# Patient Record
Sex: Male | Born: 1985 | Race: Black or African American | Hispanic: No | Marital: Single | Smoking: Current every day smoker
Health system: Southern US, Community
[De-identification: ages and names within clinical notes are randomized; demographics above are authoritative.]

## PROBLEM LIST (undated history)

## (undated) DIAGNOSIS — F419 Anxiety disorder, unspecified: Secondary | ICD-10-CM

## (undated) DIAGNOSIS — F32A Depression, unspecified: Secondary | ICD-10-CM

## (undated) DIAGNOSIS — R569 Unspecified convulsions: Secondary | ICD-10-CM

## (undated) DIAGNOSIS — G939 Disorder of brain, unspecified: Secondary | ICD-10-CM

## (undated) DIAGNOSIS — F909 Attention-deficit hyperactivity disorder, unspecified type: Secondary | ICD-10-CM

## (undated) DIAGNOSIS — F329 Major depressive disorder, single episode, unspecified: Secondary | ICD-10-CM

## (undated) DIAGNOSIS — T7840XA Allergy, unspecified, initial encounter: Secondary | ICD-10-CM

## (undated) HISTORY — DX: Major depressive disorder, single episode, unspecified: F32.9

## (undated) HISTORY — DX: Disorder of brain, unspecified: G93.9

## (undated) HISTORY — DX: Attention-deficit hyperactivity disorder, unspecified type: F90.9

## (undated) HISTORY — DX: Depression, unspecified: F32.A

## (undated) HISTORY — DX: Unspecified convulsions: R56.9

## (undated) HISTORY — DX: Allergy, unspecified, initial encounter: T78.40XA

## (undated) HISTORY — DX: Anxiety disorder, unspecified: F41.9

---
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
## 2004-07-28 ENCOUNTER — Emergency Department: Payer: Self-pay | Admitting: Emergency Medicine

## 2007-06-04 IMAGING — CR RIGHT ANKLE - COMPLETE 3+ VIEW
1 series · 5 of 5 positions shown · non-contrast
Comparison: none

REASON FOR EXAM: INJURY
COMMENTS:

[Series 563: antero_posterior · 0.11mm/px · 5 of 5 slices shown]
[im 1/5]
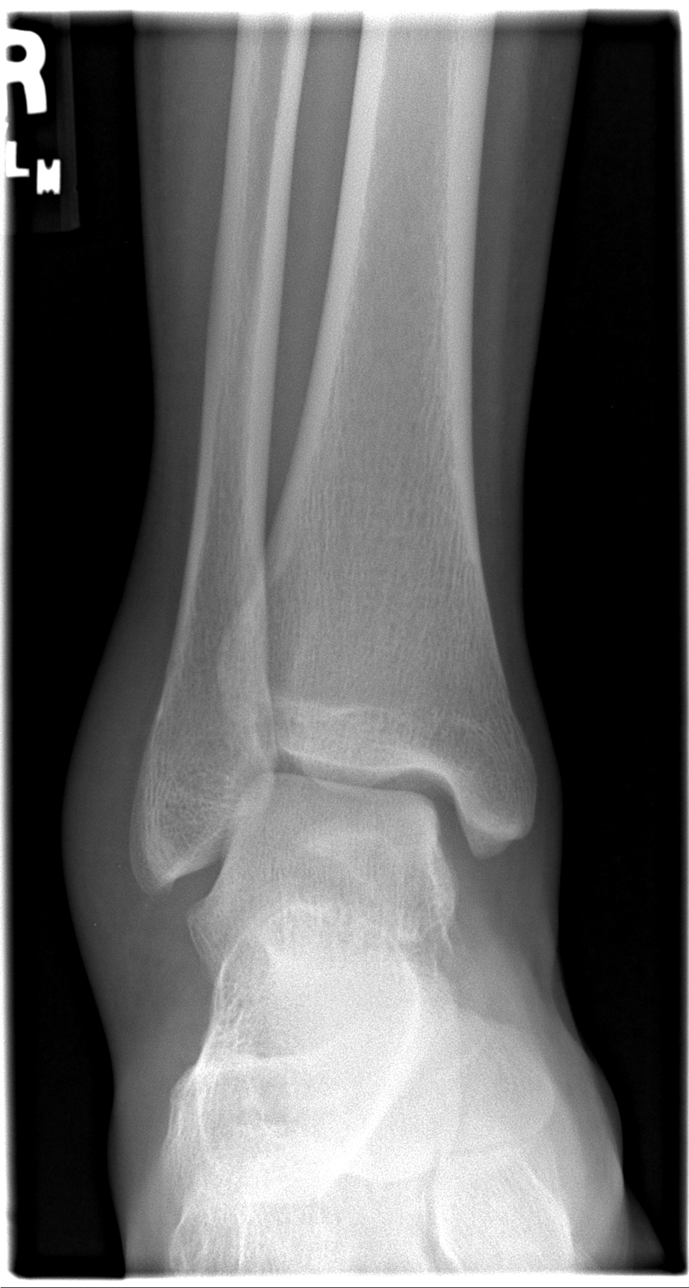
[im 2/5]
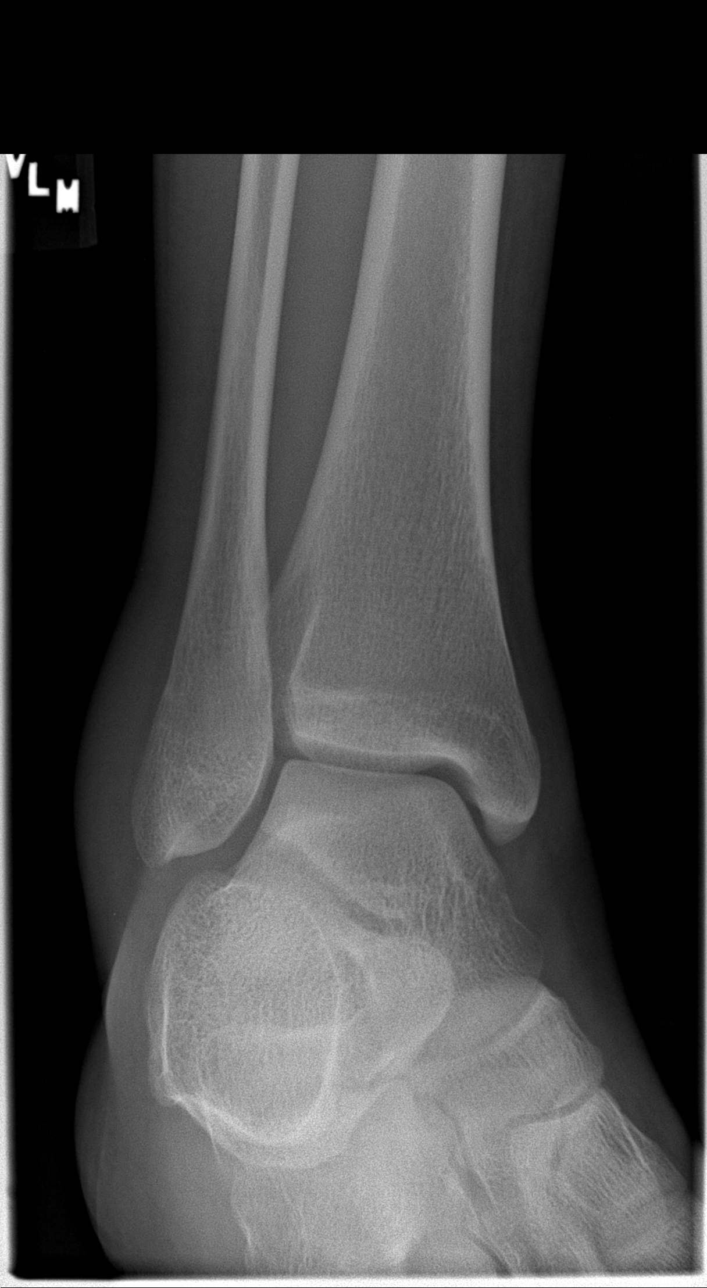
[im 3/5]
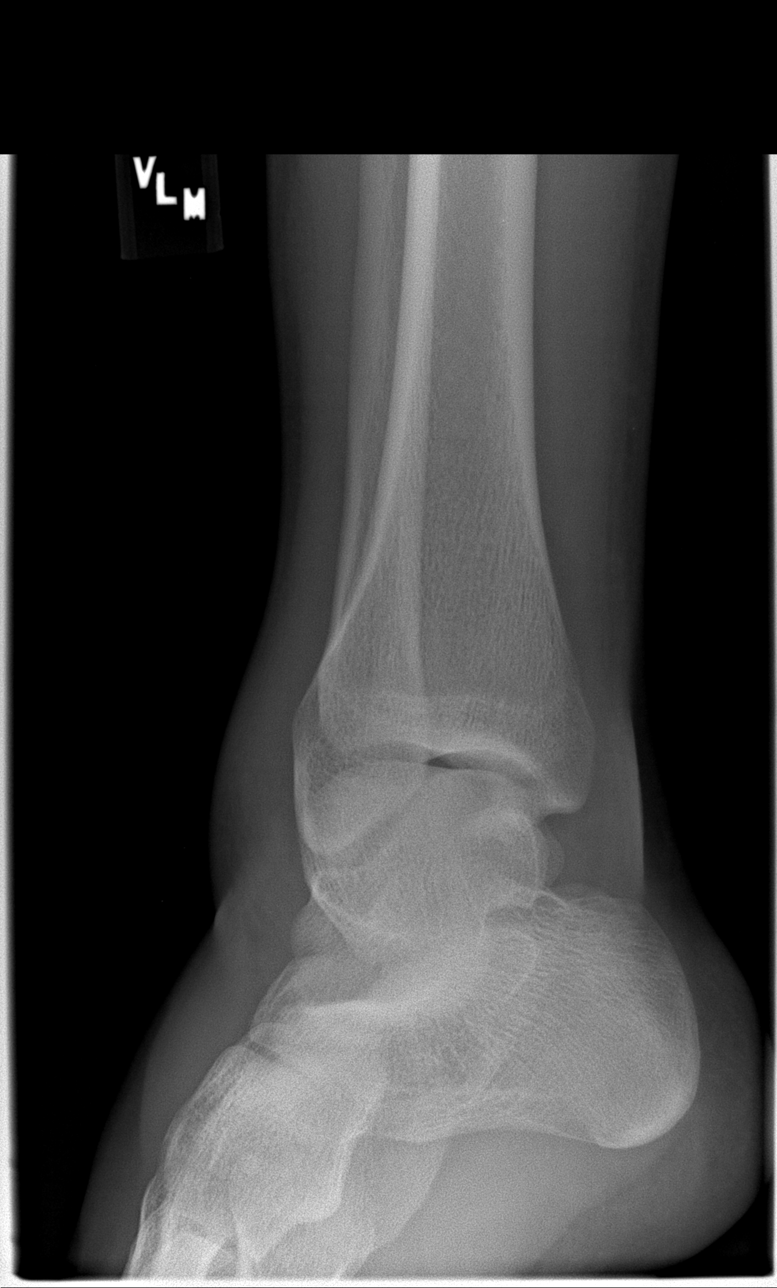
[im 4/5]
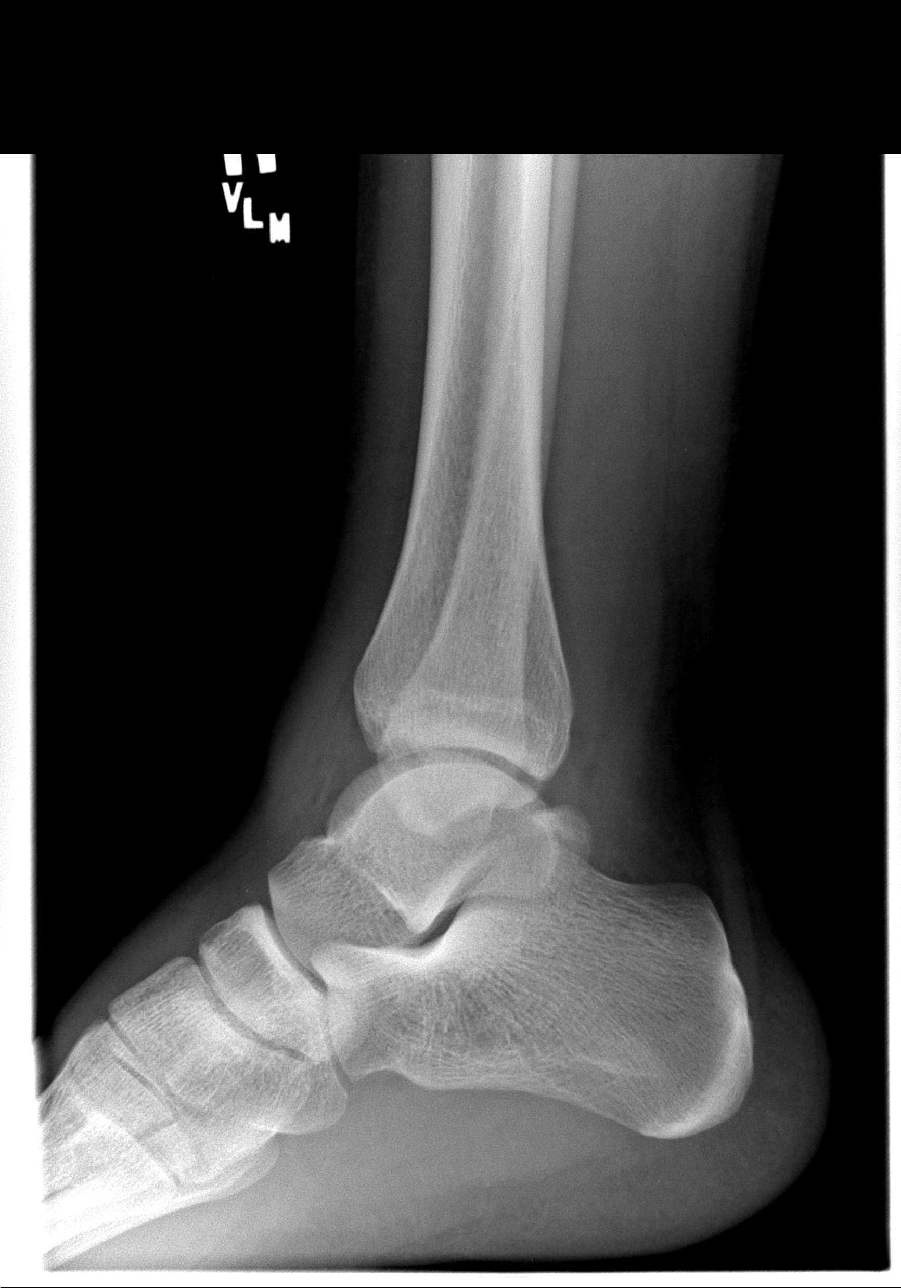
[im 5/5]
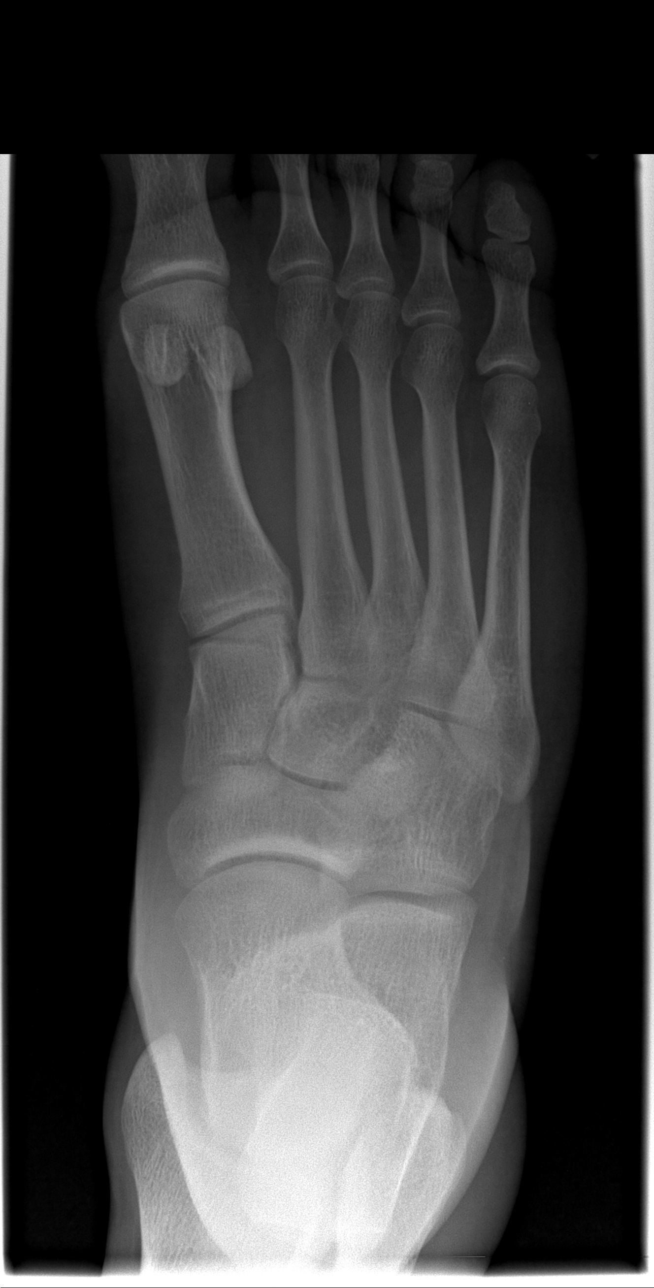

[5 of 5 positions shown; findings below may reference images not displayed]

PROCEDURE:     DXR - DXR ANKLE RIGHT COMPLETE  - July 28, 2004  [DATE]

RESULT:     Five views of the ankle reveal the joint mortise to be
preserved.  The talar dome is intact.  There is a large amount of soft
tissue swelling over the lateral malleolus.  The medial and posterior
malleolar regions are normal in appearance.  I see no fracture.
IMPRESSION: There are findings consistent with soft tissue injury over
the lateral malleolus.  I do not see evidence of an acute fracture.

## 2013-05-28 ENCOUNTER — Encounter: Payer: Self-pay | Admitting: Podiatry

## 2013-05-28 ENCOUNTER — Ambulatory Visit (INDEPENDENT_AMBULATORY_CARE_PROVIDER_SITE_OTHER): Payer: Medicaid Other | Admitting: Podiatry

## 2013-05-28 ENCOUNTER — Ambulatory Visit (INDEPENDENT_AMBULATORY_CARE_PROVIDER_SITE_OTHER): Payer: Medicaid Other

## 2013-05-28 VITALS — BP 107/70 | HR 69 | Resp 16 | Ht 70.0 in | Wt 155.0 lb

## 2013-05-28 DIAGNOSIS — M722 Plantar fascial fibromatosis: Secondary | ICD-10-CM

## 2013-05-28 DIAGNOSIS — M79673 Pain in unspecified foot: Secondary | ICD-10-CM

## 2013-05-28 DIAGNOSIS — D492 Neoplasm of unspecified behavior of bone, soft tissue, and skin: Secondary | ICD-10-CM

## 2013-05-28 MED ORDER — TRIAMCINOLONE ACETONIDE 10 MG/ML IJ SUSP
10.0000 mg | Freq: Once | INTRAMUSCULAR | Status: AC
  Administered 2013-05-28: 10 mg

## 2013-05-28 NOTE — Progress Notes (Signed)
Subjective:     Patient ID: Ryan Cohen, male   DOB: 1985/10/04, 28 y.o.   MRN: [id number]  Foot Pain   patient presents with painful nodule underneath the and his father and his uncle have the same thing and it becomes tender if he does a lot of walkingright foot stating it's been there for at least a year and a half   Review of Systems  All other systems reviewed and are negative.      Objective:   Physical Exam  Nursing note and vitals reviewed. Constitutional: He is oriented to person, place, and time.  Cardiovascular: Intact distal pulses.   Musculoskeletal: Normal range of motion.  Neurological: He is oriented to person, place, and time.  Skin: Skin is warm.   neurovascular status intact with muscle strength adequate and range of motion of the subtalar midtarsal joint within normal limits. Patient is found to have a large nodule plantar aspect right arch within the plantar fascia measuring about 2.5 cm in length by 1.5 cm in width and it's painful when pressed. 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 I discussed condition with him and caregiver and have recommended injection treatment to see if we can shrink it were reduce inflammation with possibility for excision in future even though it is a difficult procedure with difficult recovery. Patient wants this approach and had it injected 3 mg Kenalog 5 mg Xylocaine Marcaine mixture

## 2013-05-28 NOTE — Progress Notes (Signed)
   Subjective:    Patient ID: Ryan Cohen, male    DOB: 1985/04/10, 28 y.o.   MRN: [id number]  HPI Comments: i have a large knot on the bottom of my right foot. i have had it for 1.63yrs. When i stand for a long time it will hurt. Its remained the same and has not gotten bigger. i dont do anything for my foot.  Foot Pain      Review of Systems  All other systems reviewed and are negative.      Objective:   Physical Exam        Assessment & Plan:

## 2014-09-14 ENCOUNTER — Ambulatory Visit (INDEPENDENT_AMBULATORY_CARE_PROVIDER_SITE_OTHER): Payer: Medicaid Other | Admitting: Family Medicine

## 2014-09-14 ENCOUNTER — Encounter: Payer: Self-pay | Admitting: Family Medicine

## 2014-09-14 VITALS — BP 110/74 | HR 46 | Temp 98.0°F | Ht 69.4 in | Wt 141.8 lb

## 2014-09-14 DIAGNOSIS — S069XAA Unspecified intracranial injury with loss of consciousness status unknown, initial encounter: Secondary | ICD-10-CM | POA: Insufficient documentation

## 2014-09-14 DIAGNOSIS — M79671 Pain in right foot: Secondary | ICD-10-CM

## 2014-09-14 DIAGNOSIS — R2241 Localized swelling, mass and lump, right lower limb: Secondary | ICD-10-CM

## 2014-09-14 DIAGNOSIS — S069X9A Unspecified intracranial injury with loss of consciousness of unspecified duration, initial encounter: Secondary | ICD-10-CM | POA: Insufficient documentation

## 2014-09-14 DIAGNOSIS — S069X0S Unspecified intracranial injury without loss of consciousness, sequela: Secondary | ICD-10-CM

## 2014-09-14 NOTE — Patient Instructions (Signed)

## 2014-09-14 NOTE — Assessment & Plan Note (Signed)
Has guardians. Will obtain records from previous doctor.
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
## 2014-09-14 NOTE — Assessment & Plan Note (Signed)
Of unclear etiology. Will refer back to podiatry. Referral generated today.

## 2014-09-14 NOTE — Assessment & Plan Note (Signed)
Likely due to standing on cystic lesions. Will refer to podiatry for evaluation and possible removal of lesions.

## 2014-10-04 ENCOUNTER — Ambulatory Visit: Payer: Self-pay | Admitting: Podiatry

## 2014-10-04 ENCOUNTER — Ambulatory Visit: Payer: Medicaid Other | Admitting: Podiatry

## 2014-10-06 ENCOUNTER — Ambulatory Visit: Payer: Medicaid Other | Admitting: Family Medicine

## 2014-10-13 ENCOUNTER — Ambulatory Visit (INDEPENDENT_AMBULATORY_CARE_PROVIDER_SITE_OTHER): Payer: Medicaid Other

## 2014-10-13 ENCOUNTER — Encounter: Payer: Self-pay | Admitting: Podiatry

## 2014-10-13 ENCOUNTER — Ambulatory Visit (INDEPENDENT_AMBULATORY_CARE_PROVIDER_SITE_OTHER): Payer: Medicaid Other | Admitting: Podiatry

## 2014-10-13 DIAGNOSIS — M722 Plantar fascial fibromatosis: Secondary | ICD-10-CM

## 2014-10-13 DIAGNOSIS — R52 Pain, unspecified: Secondary | ICD-10-CM

## 2014-10-13 NOTE — Progress Notes (Signed)
   Subjective:    Patient ID: Danny Valdez, male    DOB: 1985/12/13, 29 y.o.   MRN: [id number]  HPI  29 year old male presents the office today for concerns about possible cyst the bottom of his right foot which didn't present for the last several years. He states the every male in his family as these lesions of the bottom of the feet. He's very have grown of the last couple years and he has some pain with pressure. He gets an occasional sharp pain to his toes on the bottom of his foot. He states that he previously had area drained however there were unable to get any fluid out of the. He is also pre-existing treated for wart. He denies any redness to the area or any skin changes. No other complaints at this time.   Review of Systems  All other systems reviewed and are negative.      Objective:   Physical Exam AAO x3, NAD DP/PT pulses palpable bilaterally, CRT less than 3 seconds Protective sensation intact with Simms Weinstein monofilament, vibratory sensation intact, Achilles tendon reflex intact On the plantar aspect of the right foot on the medial arch of the foot there are 2 soft tissue masses. There appeared to be somewhat firm, nonmobile and within the medial band of the plantar fascia. The distal lesion measures 1.5 x 2 cm in the more proximal lesion is 1 x 1 cm. There is mild tenderness to palpation overlying the area. There is no skin change or open lesions. No other lesions identified bilaterally. No other areas of tenderness to bilateral lower extremities. MMT 5/5, ROM WNL.  No open lesions or pre-ulcerative lesions.  No overlying edema, erythema, increase in warmth to bilateral lower extremities.  No pain with calf compression, swelling, warmth, erythema bilaterally.     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  -Etiology of symptoms were discussed -At this time we'll proceed with conservative treatment. I discussed steroid injection however he wishes to hold off. Prescribed compound cream to include verapamil. -Prescription for inserts were given to the patient for [facility]. -Follow-up after inserts or sooner if any problems arise. In the meantime, encouraged to call the office with any questions, concerns, change in symptoms.   Ovid Curd, DPM

## 2014-10-14 ENCOUNTER — Encounter: Payer: Self-pay | Admitting: Family Medicine

## 2014-10-14 ENCOUNTER — Ambulatory Visit (INDEPENDENT_AMBULATORY_CARE_PROVIDER_SITE_OTHER): Payer: Medicaid Other | Admitting: Family Medicine

## 2014-10-14 VITALS — BP 129/74 | HR 73 | Temp 97.4°F | Ht 68.7 in | Wt 140.0 lb

## 2014-10-14 DIAGNOSIS — Z23 Encounter for immunization: Secondary | ICD-10-CM | POA: Diagnosis not present

## 2014-10-14 DIAGNOSIS — Z0000 Encounter for general adult medical examination without abnormal findings: Secondary | ICD-10-CM

## 2014-10-14 NOTE — Progress Notes (Signed)
BP 129/74 mmHg  Pulse 73  Temp(Src) 97.4 F (36.3 C)  Ht 5' 8.7" (1.745 m)  Wt 140 lb (63.504 kg)  BMI 20.86 kg/m2  SpO2 96%   Subjective:    Patient ID: Danny Valdez, male    DOB: 02/15/1985, 29 y.o.   MRN: [id number]  HPI: Danny Valdez is a 28 y.o. male presenting on 10/14/2014 for comprehensive medical examination. Current medical complaints include: None.   Has been feeling well. Here today for annual physical. No concerns. Doing well.   He currently lives with: stepmother and father Interim Problems from his last visit: no  Depression Screen done today and results listed below:  Depression screen [facility] 2/9 10/14/2014  Decreased Interest 0  Down, Depressed, Hopeless 0  PHQ - 2 Score 0     Past Medical History:  Past Medical History  Diagnosis Date  . ADHD (attention deficit hyperactivity disorder)   . Seizures   . Allergy   . Depression   . Anxiety   . Brain damage     Surgical History:  History reviewed. No pertinent past surgical history.  Medications:  No current outpatient prescriptions on file prior to visit.   No current facility-administered medications on file prior to visit.    Allergies:  No Known Allergies  Social History:  Social History   Social History  . Marital Status: Single    Spouse Name: N/A  . Number of Children: N/A  . Years of Education: N/A   Occupational History  . Not on file.   Social History Main Topics  . Smoking status: Former Smoker    Quit date: 04/05/2014  . Smokeless tobacco: Never Used  . Alcohol Use: No  . Drug Use: No  . Sexual Activity: No   Other Topics Concern  . Not on file   Social History Narrative   History  Smoking status  . Former Smoker  . Quit date: 04/05/2014  Smokeless tobacco  . Never Used   History  Alcohol Use No    Family History:  Family History  Problem Relation Age of Onset  . Hypertension Father   . Diabetes Maternal Grandmother   . Hypertension Maternal  Grandmother   . Diabetes Paternal Grandmother   . Gout Paternal Grandfather   . Diabetes Paternal Grandfather   . Hypertension Paternal Grandfather   . Aneurysm Paternal Grandfather     Past medical history, surgical history, medications, allergies, family history and social history reviewed with patient today and changes made to appropriate areas of the chart.   Review of Systems  Constitutional: Negative.   HENT: Negative.   Eyes: Negative.   Respiratory: Negative.   Cardiovascular: Negative.   Gastrointestinal: Negative.   Genitourinary: Negative.   Musculoskeletal: Positive for back pain. Negative for myalgias, joint pain, falls and neck pain.  Skin: Negative.   Neurological: Negative.   Endo/Heme/Allergies: Negative.   Psychiatric/Behavioral: Negative.     All other ROS negative except what is listed above and in the HPI.      Objective:    BP 129/74 mmHg  Pulse 73  Temp(Src) 97.4 F (36.3 C)  Ht 5' 8.7" (1.745 m)  Wt 140 lb (63.504 kg)  BMI 20.86 kg/m2  SpO2 96%  Wt Readings from Last 3 Encounters:  10/14/14 140 lb (63.504 kg)  09/14/14 141 lb 12.8 oz (64.32 kg)    Physical Exam  Constitutional: He is oriented to person, place, and time. He appears well-developed and well-nourished.  No distress.  HENT:  Head: Normocephalic and atraumatic.  Right Ear: Hearing and external ear normal.  Left Ear: Hearing and external ear normal.  Nose: Nose normal.  Mouth/Throat: Oropharynx is clear and moist. No oropharyngeal exudate.  Eyes: Conjunctivae, EOM and lids are normal. Pupils are equal, round, and reactive to light. Right eye exhibits no discharge. Left eye exhibits no discharge. No scleral icterus.  Neck: Normal range of motion. Neck supple. No JVD present. No tracheal deviation present. No thyromegaly present.  Cardiovascular: Normal rate, regular rhythm, normal heart sounds and intact distal pulses.  Exam reveals no gallop and no friction rub.   No murmur  heard. Pulmonary/Chest: Effort normal and breath sounds normal. No stridor. No respiratory distress. He has no wheezes. He has no rales. He exhibits no tenderness.  Abdominal: Soft. Bowel sounds are normal. He exhibits no distension and no mass. There is no tenderness. There is no rebound and no guarding. Hernia confirmed negative in the right inguinal area and confirmed negative in the left inguinal area.  Genitourinary: Testes normal and penis normal. Right testis shows no mass, no swelling and no tenderness. Right testis is descended. Cremasteric reflex is not absent on the right side. Left testis shows no mass, no swelling and no tenderness. Left testis is descended. Cremasteric reflex is not absent on the left side. Circumcised. No penile tenderness. No discharge found.  Done with Tiffany Reel, CMA as chaperone  Musculoskeletal: Normal range of motion. He exhibits no edema or tenderness.  Lymphadenopathy:    He has no cervical adenopathy.       Right: No inguinal adenopathy present.       Left: No inguinal adenopathy present.  Neurological: He is alert and oriented to person, place, and time. He has normal reflexes. He displays normal reflexes. No cranial nerve deficit. He exhibits normal muscle tone. Coordination normal.  Skin: Skin is warm, dry and intact. No rash noted. He is not diaphoretic. No erythema. No pallor.  Psychiatric: He has a normal mood and affect. His speech is normal and behavior is normal. Judgment and thought content normal. Cognition and memory are normal.  Nursing note and vitals reviewed.   No results found for this or any previous visit.    Assessment & Plan:   Problem List Items Addressed This Visit    None    Visit Diagnoses    Routine general medical examination at a health care facility    -  Primary    Up to date on vaccines. Checking screening labs next week when he is fasting. No need for any other screenings at this time. Work on diet and exercise.  Monitor.    Relevant Orders    CBC With Differential/Platelet    Comprehensive metabolic panel    Lipid Panel w/o Chol/HDL Ratio    TSH    UA/M w/rflx Culture, Routine    Immunization due        Flu shot given today.     Relevant Orders    Flu Vaccine QUAD 36+ mos PF IM (Fluarix & Fluzone Quad PF) (Completed)       LABORATORY TESTING:  Health maintenance labs ordered today as discussed above.   IMMUNIZATIONS:   - Tdap: Tetanus vaccination status reviewed: last tetanus booster within 10 years. - Influenza: Administered today  PATIENT COUNSELING:    Sexuality: Discussed sexually transmitted diseases, partner selection, use of condoms, avoidance of unintended pregnancy  and contraceptive alternatives.   Advised to avoid  cigarette smoking.  I discussed with the patient that most people either abstain from alcohol or drink within safe limits (<=14/week and <=4 drinks/occasion for males, <=7/weeks and <= 3 drinks/occasion for females) and that the risk for alcohol disorders and other health effects rises proportionally with the number of drinks per week and how often a drinker exceeds daily limits.  Discussed cessation/primary prevention of drug use and availability of treatment for abuse.   Diet: Encouraged to adjust caloric intake to maintain  or achieve ideal body weight, to reduce intake of dietary saturated fat and total fat, to limit sodium intake by avoiding high sodium foods and not adding table salt, and to maintain adequate dietary potassium and calcium preferably from fresh fruits, vegetables, and low-fat dairy products.    stressed the importance of regular exercise  Injury prevention: Discussed safety belts, safety helmets, smoke detector, smoking near bedding or upholstery.   Dental health: Discussed importance of regular tooth brushing, flossing, and dental visits.   Follow up plan: NEXT PREVENTATIVE PHYSICAL DUE IN 1 YEAR. Return Next week for blood work, 1 year for  PE.

## 2014-10-14 NOTE — Patient Instructions (Signed)

## 2014-10-17 ENCOUNTER — Other Ambulatory Visit: Payer: Medicaid Other

## 2014-10-17 DIAGNOSIS — Z0000 Encounter for general adult medical examination without abnormal findings: Secondary | ICD-10-CM

## 2014-10-17 LAB — UA/M W/RFLX CULTURE, ROUTINE
Bilirubin, UA: NEGATIVE
Glucose, UA: NEGATIVE
Ketones, UA: NEGATIVE
LEUKOCYTES UA: NEGATIVE
NITRITE UA: NEGATIVE
PH UA: 6.5 (ref 5.0–7.5)
Protein, UA: NEGATIVE
RBC UA: NEGATIVE
SPEC GRAV UA: 1.015 (ref 1.005–1.030)
Urobilinogen, Ur: 1 mg/dL (ref 0.2–1.0)

## 2014-10-17 NOTE — Addendum Note (Signed)
Addended by: Dorcas Carrow on: 10/17/2014 08:36 AM   Modules accepted: Orders, SmartSet

## 2014-10-18 ENCOUNTER — Telehealth: Payer: Self-pay | Admitting: Family Medicine

## 2014-10-18 DIAGNOSIS — D72819 Decreased white blood cell count, unspecified: Secondary | ICD-10-CM

## 2014-10-18 LAB — COMPREHENSIVE METABOLIC PANEL
A/G RATIO: 1.7 (ref 1.1–2.5)
ALK PHOS: 44 IU/L (ref 39–117)
ALT: 13 IU/L (ref 0–44)
AST: 17 IU/L (ref 0–40)
Albumin: 4.5 g/dL (ref 3.5–5.5)
BILIRUBIN TOTAL: 1.6 mg/dL — AB (ref 0.0–1.2)
BUN/Creatinine Ratio: 11 (ref 8–19)
BUN: 11 mg/dL (ref 6–20)
CO2: 25 mmol/L (ref 18–29)
Calcium: 9.4 mg/dL (ref 8.7–10.2)
Chloride: 99 mmol/L (ref 97–108)
Creatinine, Ser: 0.98 mg/dL (ref 0.76–1.27)
GFR calc Af Amer: 119 mL/min/{1.73_m2} (ref >59)
GFR, EST NON AFRICAN AMERICAN: 103 mL/min/{1.73_m2} (ref >59)
GLOBULIN, TOTAL: 2.6 g/dL (ref 1.5–4.5)
Glucose: 100 mg/dL — ABNORMAL HIGH (ref 65–99)
POTASSIUM: 4.1 mmol/L (ref 3.5–5.2)
SODIUM: 140 mmol/L (ref 134–144)
Total Protein: 7.1 g/dL (ref 6.0–8.5)

## 2014-10-18 LAB — LIPID PANEL W/O CHOL/HDL RATIO
Cholesterol, Total: 173 mg/dL (ref 100–199)
HDL: 60 mg/dL (ref >39)
LDL Calculated: 99 mg/dL (ref 0–99)
TRIGLYCERIDES: 69 mg/dL (ref 0–149)
VLDL Cholesterol Cal: 14 mg/dL (ref 5–40)

## 2014-10-18 LAB — TSH: TSH: 2.39 u[IU]/mL (ref 0.450–4.500)

## 2014-10-18 LAB — CBC WITH DIFFERENTIAL/PLATELET
BASOS ABS: 0 10*3/uL (ref 0.0–0.2)
Basos: 1 %
EOS (ABSOLUTE): 0.2 10*3/uL (ref 0.0–0.4)
Eos: 6 %
Hematocrit: 41.1 % (ref 37.5–51.0)
Hemoglobin: 13.4 g/dL (ref 12.6–17.7)
Immature Grans (Abs): 0 10*3/uL (ref 0.0–0.1)
Immature Granulocytes: 0 %
LYMPHS ABS: 1.5 10*3/uL (ref 0.7–3.1)
Lymphs: 51 %
MCH: 30.5 pg (ref 26.6–33.0)
MCHC: 32.6 g/dL (ref 31.5–35.7)
MCV: 94 fL (ref 79–97)
MONOCYTES: 11 %
Monocytes Absolute: 0.3 10*3/uL (ref 0.1–0.9)
NEUTROS ABS: 0.9 10*3/uL — AB (ref 1.4–7.0)
Neutrophils: 31 %
Platelets: 247 10*3/uL (ref 150–379)
RBC: 4.39 x10E6/uL (ref 4.14–5.80)
RDW: 12.5 % (ref 12.3–15.4)
WBC: 2.9 10*3/uL — ABNORMAL LOW (ref 3.4–10.8)

## 2014-10-18 NOTE — Telephone Encounter (Signed)
Spoke with Dad. Will come in in 2 weeks for recheck on his CBC.

## 2014-10-18 NOTE — Telephone Encounter (Signed)
LMOM for him or father to call back. Labs normal except WBC which is quite low. Will recheck in 2 weeks in case this is due to post-infectious cause. If not better, will refer to hematology.

## 2014-11-01 ENCOUNTER — Other Ambulatory Visit: Payer: Self-pay

## 2014-11-08 ENCOUNTER — Other Ambulatory Visit: Payer: Medicaid Other

## 2014-11-08 DIAGNOSIS — D72819 Decreased white blood cell count, unspecified: Secondary | ICD-10-CM

## 2014-11-09 ENCOUNTER — Encounter: Payer: Self-pay | Admitting: Family Medicine

## 2014-11-09 LAB — CBC WITH DIFFERENTIAL/PLATELET
BASOS ABS: 0 10*3/uL (ref 0.0–0.2)
Basos: 1 %
EOS (ABSOLUTE): 0.1 10*3/uL (ref 0.0–0.4)
Eos: 4 %
Hematocrit: 40 % (ref 37.5–51.0)
Hemoglobin: 13.2 g/dL (ref 12.6–17.7)
IMMATURE GRANS (ABS): 0 10*3/uL (ref 0.0–0.1)
IMMATURE GRANULOCYTES: 0 %
Lymphocytes Absolute: 1.8 10*3/uL (ref 0.7–3.1)
Lymphs: 47 %
MCH: 30.8 pg (ref 26.6–33.0)
MCHC: 33 g/dL (ref 31.5–35.7)
MCV: 93 fL (ref 79–97)
Monocytes Absolute: 0.4 10*3/uL (ref 0.1–0.9)
Monocytes: 11 %
NEUTROS PCT: 37 %
Neutrophils Absolute: 1.4 10*3/uL (ref 1.4–7.0)
PLATELETS: 253 10*3/uL (ref 150–379)
RBC: 4.29 x10E6/uL (ref 4.14–5.80)
RDW: 11.9 % — AB (ref 12.3–15.4)
WBC: 3.9 10*3/uL (ref 3.4–10.8)

## 2015-09-13 ENCOUNTER — Encounter (INDEPENDENT_AMBULATORY_CARE_PROVIDER_SITE_OTHER): Payer: Self-pay

## 2015-10-17 ENCOUNTER — Encounter: Payer: Medicaid Other | Admitting: Family Medicine

## 2015-11-06 ENCOUNTER — Encounter: Payer: Medicaid Other | Admitting: Family Medicine

## 2015-12-05 ENCOUNTER — Ambulatory Visit: Payer: Self-pay | Admitting: Family Medicine

## 2015-12-07 ENCOUNTER — Ambulatory Visit (INDEPENDENT_AMBULATORY_CARE_PROVIDER_SITE_OTHER): Payer: Medicaid Other | Admitting: Family Medicine

## 2015-12-07 ENCOUNTER — Encounter: Payer: Self-pay | Admitting: Family Medicine

## 2015-12-07 VITALS — BP 114/70 | HR 61 | Temp 97.6°F | Wt 139.0 lb

## 2015-12-07 DIAGNOSIS — Z113 Encounter for screening for infections with a predominantly sexual mode of transmission: Secondary | ICD-10-CM

## 2015-12-07 DIAGNOSIS — J069 Acute upper respiratory infection, unspecified: Secondary | ICD-10-CM

## 2015-12-07 MED ORDER — AMOXICILLIN-POT CLAVULANATE 875-125 MG PO TABS
1.0000 | ORAL_TABLET | Freq: Two times a day (BID) | ORAL | 0 refills | Status: DC
Start: 2015-12-07 — End: 2015-12-25
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
## 2015-12-07 NOTE — Patient Instructions (Signed)
Follow up as needed

## 2015-12-08 LAB — RPR: RPR: NONREACTIVE

## 2015-12-08 LAB — HSV(HERPES SIMPLEX VRS) I + II AB-IGG
HSV 1 Glycoprotein G Ab, IgG: 52.4 index — ABNORMAL HIGH (ref 0.00–0.90)
HSV 2 Glycoprotein G Ab, IgG: <0.91 index (ref 0.00–0.90)

## 2015-12-08 LAB — HIV ANTIBODY (ROUTINE TESTING W REFLEX): HIV Screen 4th Generation wRfx: NONREACTIVE

## 2015-12-09 LAB — GC/CHLAMYDIA PROBE AMP
Chlamydia trachomatis, NAA: NEGATIVE
Neisseria gonorrhoeae by PCR: NEGATIVE

## 2015-12-11 ENCOUNTER — Telehealth: Payer: Self-pay | Admitting: Family Medicine

## 2015-12-11 NOTE — Telephone Encounter (Signed)
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 If he ever notices a cold sore, he can let me know and I can give him medicine for it.

## 2015-12-11 NOTE — Telephone Encounter (Signed)
Called and spoke to patient's father, DPR signed under media in chart. I let patient's father know what Fleet ContrasRachel said. Father stated that the patient gets cold sores often and that they would call the next time the patient got one.

## 2015-12-25 ENCOUNTER — Encounter: Payer: Self-pay | Admitting: Family Medicine

## 2015-12-25 ENCOUNTER — Ambulatory Visit (INDEPENDENT_AMBULATORY_CARE_PROVIDER_SITE_OTHER): Payer: Medicaid Other | Admitting: Family Medicine

## 2015-12-25 ENCOUNTER — Other Ambulatory Visit: Payer: Self-pay | Admitting: Family Medicine

## 2015-12-25 VITALS — BP 95/77 | HR 65 | Temp 97.8°F | Ht 69.25 in | Wt 140.6 lb

## 2015-12-25 DIAGNOSIS — Z0000 Encounter for general adult medical examination without abnormal findings: Secondary | ICD-10-CM | POA: Diagnosis not present

## 2015-12-25 DIAGNOSIS — Z23 Encounter for immunization: Secondary | ICD-10-CM | POA: Diagnosis not present

## 2015-12-25 LAB — UA/M W/RFLX CULTURE, ROUTINE
Bilirubin, UA: NEGATIVE
Glucose, UA: NEGATIVE
KETONES UA: NEGATIVE
Leukocytes, UA: NEGATIVE
NITRITE UA: NEGATIVE
Protein, UA: NEGATIVE
SPEC GRAV UA: 1.02 (ref 1.005–1.030)
UUROB: 0.2 mg/dL (ref 0.2–1.0)
pH, UA: 6 (ref 5.0–7.5)

## 2015-12-25 LAB — MICROSCOPIC EXAMINATION: WBC UA: NONE SEEN /HPF (ref 0–?)

## 2015-12-25 NOTE — Patient Instructions (Addendum)

## 2015-12-25 NOTE — Progress Notes (Signed)
BP 95/77 (BP Location: Left Arm, Patient Position: Sitting, Cuff Size: Normal)   Pulse 65   Temp 97.8 F (36.6 C)   Ht 5' 9.25" (1.759 m)   Wt 140 lb 9.6 oz (63.8 kg)   SpO2 97%   BMI 20.61 kg/m    Subjective:    Patient ID: Danny Valdez, male    DOB: 09/11/1985, 30 y.o.   MRN: [id number]  HPI: Danny Valdez is a 30 y.o. male presenting on 12/25/2015 for comprehensive medical examination. Current medical complaints include:none  He currently lives with: Dad and step-mom Interim Problems from his last visit: no  Depression Screen done today and results listed below:  [facility] 12/25/2015 10/14/2014  Decreased Interest 0 0  Down, Depressed, Hopeless 0 0  PHQ - 2 Score 0 0  Altered sleeping 1 -  Tired, decreased energy 0 -  Change in appetite 0 -  Feeling bad or failure about yourself  0 -  Trouble concentrating 0 -  Moving slowly or fidgety/restless 0 -  Suicidal thoughts 0 -  PHQ-9 Score 1 -    Past Medical History:  Past Medical History:  Diagnosis Date  . ADHD (attention deficit hyperactivity disorder)   . Allergy   . Anxiety   . Brain damage   . Depression   . Seizures (HCC)     Surgical History:  No past surgical history on file.  Medications:  No current outpatient prescriptions on file prior to visit.   No current facility-administered medications on file prior to visit.     Allergies:  No Known Allergies  Social History:  Social History   Social History  . Marital status: Single    Spouse name: N/A  . Number of children: N/A  . Years of education: N/A   Occupational History  . Not on file.   Social History Main Topics  . Smoking status: Former Smoker    Quit date: 04/05/2014  . Smokeless tobacco: Never Used  . Alcohol use No     Comment: Occasionally/Rarely  . Drug use: No  . Sexual activity: No   Other Topics Concern  . Not on file   Social History Narrative  . No narrative on file   History  Smoking  Status  . Former Smoker  . Quit date: 04/05/2014  Smokeless Tobacco  . Never Used   History  Alcohol Use No    Comment: Occasionally/Rarely    Family History:  Family History  Problem Relation Age of Onset  . Hypertension Father   . Diabetes Maternal Grandmother   . Hypertension Maternal Grandmother   . Diabetes Paternal Grandmother   . Gout Paternal Grandfather   . Diabetes Paternal Grandfather   . Hypertension Paternal Grandfather   . Aneurysm Paternal Grandfather     Past medical history, surgical history, medications, allergies, family history and social history reviewed with patient today and changes made to appropriate areas of the chart.   Review of Systems  Constitutional: Negative.   HENT: Negative.   Eyes: Negative.   Respiratory: Negative.   Cardiovascular: Negative.   Gastrointestinal: Negative.   Genitourinary: Negative.   Musculoskeletal: Negative.   Skin: Negative.   Neurological: Negative.   Endo/Heme/Allergies: Negative.   Psychiatric/Behavioral: Negative.     All other ROS negative except what is listed above and in the HPI.      Objective:    BP 95/77 (BP Location: Left Arm, Patient Position: Sitting, Cuff Size:  Normal)   Pulse 65   Temp 97.8 F (36.6 C)   Ht 5' 9.25" (1.759 m)   Wt 140 lb 9.6 oz (63.8 kg)   SpO2 97%   BMI 20.61 kg/m   Wt Readings from Last 3 Encounters:  12/25/15 140 lb 9.6 oz (63.8 kg)  12/07/15 139 lb (63 kg)  10/14/14 140 lb (63.5 kg)    Physical Exam  Constitutional: He is oriented to person, place, and time. He appears well-developed and well-nourished. No distress.  HENT:  Head: Normocephalic and atraumatic.  Right Ear: Hearing and external ear normal.  Left Ear: Hearing and external ear normal.  Nose: Nose normal.  Mouth/Throat: Oropharynx is clear and moist. No oropharyngeal exudate.  Eyes: Conjunctivae, EOM and lids are normal. Pupils are equal, round, and reactive to light. Right eye exhibits no  discharge. Left eye exhibits no discharge. No scleral icterus.  Neck: Normal range of motion. Neck supple. No JVD present. No tracheal deviation present. No thyromegaly present.  Cardiovascular: Normal rate, regular rhythm, normal heart sounds and intact distal pulses.  Exam reveals no gallop and no friction rub.   No murmur heard. Pulmonary/Chest: Effort normal and breath sounds normal. No stridor. No respiratory distress. He has no wheezes. He has no rales. He exhibits no tenderness.  Abdominal: Soft. Bowel sounds are normal. He exhibits no distension and no mass. There is no tenderness. There is no rebound and no guarding. Hernia confirmed negative in the right inguinal area and confirmed negative in the left inguinal area.  Genitourinary: Testes normal and penis normal. Cremasteric reflex is present. Circumcised. No penile tenderness.  Genitourinary Comments: Genital exam done with Myrtha Mantis, CMA present  Musculoskeletal: Normal range of motion. He exhibits no edema, tenderness or deformity.  Lymphadenopathy:    He has no cervical adenopathy.  Neurological: He is alert and oriented to person, place, and time. He has normal reflexes. He displays normal reflexes. No cranial nerve deficit. He exhibits normal muscle tone. Coordination normal.  Skin: Skin is warm and intact. No rash noted. He is not diaphoretic. No erythema. No pallor.  Psychiatric: He has a normal mood and affect. His speech is normal and behavior is normal. Judgment and thought content normal. Cognition and memory are normal.  Vitals reviewed.   Results for orders placed or performed in visit on 12/07/15  GC/Chlamydia Probe Amp  Result Value Ref Range   Chlamydia trachomatis, NAA Negative Negative   Neisseria gonorrhoeae by PCR Negative Negative  HIV antibody  Result Value Ref Range   HIV Screen 4th Generation wRfx Non Reactive Non Reactive  RPR  Result Value Ref Range   RPR Ser Ql Non Reactive Non Reactive    HSV(herpes simplex vrs) 1+2 ab-IgG  Result Value Ref Range   HSV 1 Glycoprotein G Ab, IgG 52.40 (H) 0.00 - 0.90 index   HSV 2 Glycoprotein G Ab, IgG <0.91 0.00 - 0.90 index      Assessment & Plan:   Problem List Items Addressed This Visit    None    Visit Diagnoses    Routine general medical examination at a health care facility    -  Primary   Doing well. Up to date on vaccines. Screening labs checked today. Continue diet and exercise. Call with any concerns.    Need for influenza vaccination       Flu shot given today.   Relevant Orders   Flu Vaccine QUAD 36+ mos PF IM (Fluarix & Fluzone  Quad PF) (Completed)       Discussed aspirin prophylaxis for myocardial infarction prevention and decision was it was not indicated  LABORATORY TESTING:  Health maintenance labs ordered today as discussed above.   IMMUNIZATIONS:   - Tdap: Tetanus vaccination status reviewed: last tetanus booster within 10 years. - Influenza: Administered today - Pneumovax: Not applicable  PATIENT COUNSELING:    Sexuality: Discussed sexually transmitted diseases, partner selection, use of condoms, avoidance of unintended pregnancy  and contraceptive alternatives.   Advised to avoid cigarette smoking.  I discussed with the patient that most people either abstain from alcohol or drink within safe limits (<=14/week and <=4 drinks/occasion for males, <=7/weeks and <= 3 drinks/occasion for females) and that the risk for alcohol disorders and other health effects rises proportionally with the number of drinks per week and how often a drinker exceeds daily limits.  Discussed cessation/primary prevention of drug use and availability of treatment for abuse.   Diet: Encouraged to adjust caloric intake to maintain  or achieve ideal body weight, to reduce intake of dietary saturated fat and total fat, to limit sodium intake by avoiding high sodium foods and not adding table salt, and to maintain adequate dietary  potassium and calcium preferably from fresh fruits, vegetables, and low-fat dairy products.    stressed the importance of regular exercise  Injury prevention: Discussed safety belts, safety helmets, smoke detector, smoking near bedding or upholstery.   Dental health: Discussed importance of regular tooth brushing, flossing, and dental visits.   Follow up plan: NEXT PREVENTATIVE PHYSICAL DUE IN 1 YEAR. Return in about 1 year (around 12/24/2016) for Physical.

## 2015-12-26 ENCOUNTER — Encounter: Payer: Self-pay | Admitting: Family Medicine

## 2015-12-26 LAB — COMPREHENSIVE METABOLIC PANEL
ALBUMIN: 4.6 g/dL (ref 3.5–5.5)
ALK PHOS: 48 IU/L (ref 39–117)
ALT: 8 IU/L (ref 0–44)
AST: 17 IU/L (ref 0–40)
Albumin/Globulin Ratio: 1.6 (ref 1.2–2.2)
BILIRUBIN TOTAL: 1.5 mg/dL — AB (ref 0.0–1.2)
BUN / CREAT RATIO: 12 (ref 9–20)
BUN: 11 mg/dL (ref 6–20)
CHLORIDE: 100 mmol/L (ref 96–106)
CO2: 25 mmol/L (ref 18–29)
Calcium: 9.6 mg/dL (ref 8.7–10.2)
Creatinine, Ser: 0.91 mg/dL (ref 0.76–1.27)
GFR calc non Af Amer: 112 mL/min/{1.73_m2} (ref >59)
GFR, EST AFRICAN AMERICAN: 129 mL/min/{1.73_m2} (ref >59)
GLOBULIN, TOTAL: 2.9 g/dL (ref 1.5–4.5)
Glucose: 72 mg/dL (ref 65–99)
Potassium: 4.2 mmol/L (ref 3.5–5.2)
SODIUM: 140 mmol/L (ref 134–144)
TOTAL PROTEIN: 7.5 g/dL (ref 6.0–8.5)

## 2015-12-26 LAB — LIPID PANEL W/O CHOL/HDL RATIO
CHOLESTEROL TOTAL: 165 mg/dL (ref 100–199)
HDL: 58 mg/dL (ref >39)
LDL CALC: 81 mg/dL (ref 0–99)
Triglycerides: 130 mg/dL (ref 0–149)
VLDL Cholesterol Cal: 26 mg/dL (ref 5–40)

## 2015-12-26 LAB — CBC WITH DIFFERENTIAL/PLATELET
BASOS ABS: 0 10*3/uL (ref 0.0–0.2)
BASOS: 1 %
EOS (ABSOLUTE): 0.3 10*3/uL (ref 0.0–0.4)
EOS: 8 %
HEMATOCRIT: 40.7 % (ref 37.5–51.0)
HEMOGLOBIN: 13.5 g/dL (ref 13.0–17.7)
Immature Grans (Abs): 0 10*3/uL (ref 0.0–0.1)
Immature Granulocytes: 1 %
LYMPHS ABS: 1.9 10*3/uL (ref 0.7–3.1)
Lymphs: 46 %
MCH: 30.7 pg (ref 26.6–33.0)
MCHC: 33.2 g/dL (ref 31.5–35.7)
MCV: 93 fL (ref 79–97)
MONOCYTES: 10 %
Monocytes Absolute: 0.4 10*3/uL (ref 0.1–0.9)
NEUTROS ABS: 1.4 10*3/uL (ref 1.4–7.0)
Neutrophils: 34 %
Platelets: 290 10*3/uL (ref 150–379)
RBC: 4.4 x10E6/uL (ref 4.14–5.80)
RDW: 12.2 % — ABNORMAL LOW (ref 12.3–15.4)
WBC: 4 10*3/uL (ref 3.4–10.8)

## 2015-12-26 LAB — TSH: TSH: 0.798 u[IU]/mL (ref 0.450–4.500)

## 2016-12-05 ENCOUNTER — Encounter: Payer: Medicaid Other | Admitting: Family Medicine

## 2016-12-23 ENCOUNTER — Ambulatory Visit: Payer: Self-pay
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
## 2016-12-24 ENCOUNTER — Ambulatory Visit: Payer: Medicaid Other | Admitting: Family Medicine

## 2016-12-26 ENCOUNTER — Encounter: Payer: Medicaid Other | Admitting: Family Medicine

## 2017-05-30 ENCOUNTER — Other Ambulatory Visit: Payer: Self-pay

## 2017-05-30 ENCOUNTER — Emergency Department
Admission: EM | Admit: 2017-05-30 | Discharge: 2017-05-30 | Disposition: A | Discharge status: Home / Self Care | Payer: Medicaid Other | Attending: Emergency Medicine | Admitting: Emergency Medicine

## 2017-05-30 ENCOUNTER — Encounter: Payer: Self-pay | Admitting: Emergency Medicine

## 2017-05-30 DIAGNOSIS — S61215A Laceration without foreign body of left ring finger without damage to nail, initial encounter: Secondary | ICD-10-CM | POA: Diagnosis not present

## 2017-05-30 DIAGNOSIS — Y93E6 Activity, residential relocation: Secondary | ICD-10-CM | POA: Insufficient documentation

## 2017-05-30 DIAGNOSIS — Z23 Encounter for immunization: Secondary | ICD-10-CM | POA: Insufficient documentation

## 2017-05-30 DIAGNOSIS — F17200 Nicotine dependence, unspecified, uncomplicated: Secondary | ICD-10-CM | POA: Diagnosis not present

## 2017-05-30 DIAGNOSIS — W268XXA Contact with other sharp object(s), not elsewhere classified, initial encounter: Secondary | ICD-10-CM | POA: Diagnosis not present

## 2017-05-30 DIAGNOSIS — Y929 Unspecified place or not applicable: Secondary | ICD-10-CM | POA: Diagnosis not present

## 2017-05-30 DIAGNOSIS — Y998 Other external cause status: Secondary | ICD-10-CM | POA: Insufficient documentation

## 2017-05-30 DIAGNOSIS — S61211A Laceration without foreign body of left index finger without damage to nail, initial encounter: Secondary | ICD-10-CM | POA: Diagnosis not present

## 2017-05-30 MED ORDER — LIDOCAINE HCL (PF) 1 % IJ SOLN
INTRAMUSCULAR | Status: AC
  Filled 2017-05-30: qty 5

## 2017-05-30 MED ORDER — TETANUS-DIPHTH-ACELL PERTUSSIS 5-2.5-18.5 LF-MCG/0.5 IM SUSP: 0.5000 mL | Freq: Once | INTRAMUSCULAR | Status: DC

## 2017-05-30 MED ORDER — LIDOCAINE HCL (PF) 1 % IJ SOLN: 5.0000 mL | Freq: Once | INTRAMUSCULAR | Status: DC

## 2017-05-30 NOTE — ED Triage Notes (Signed)
Pt presents to ED via POV c/o approx. 1 cm laceration to L 4th finger. Small amount of bleeding present. Pt states he was helping family to move a freezer and cut hand on exposed metal in back. Pt does not think he has had a tetanus shot within 5 years.

## 2017-05-30 NOTE — ED Provider Notes (Signed)
[facility] EMERGENCY DEPARTMENT Provider Note   CSN: [id number] Arrival date & time: 05/30/17  1557     History   Chief Complaint Chief Complaint  Patient presents with  . Laceration    HPI Ryan Cohen is a 32 y.o. male presents with family for evaluation of left index and ring finger laceration.  Patient was helping to lift a freezer when patient was lifting he cut his left index and ring finger on a piece of metal.  Patient denies any limited range of motion of the left index finger or ring finger.  He has lacerations to the volar aspect of the DIP joints of both digits.  His tetanus is not up-to-date.  His pain is mild.  Sensation is intact distally.  He has not had any medications for pain.  HPI  History reviewed. No pertinent past medical history.  There are no active problems to display for this patient.   History reviewed. No pertinent surgical history.      Home Medications    Prior to Admission medications   Not on File    Family History History reviewed. No pertinent family history.  Social History Social History   Tobacco Use  . Smoking status: Current Every Day Smoker  Substance Use Topics  . Alcohol use: Yes  . Drug use: Not on file     Allergies   Patient has no known allergies.   Review of Systems Review of Systems  Constitutional: Negative for fever.  Respiratory: Negative for shortness of breath.   Cardiovascular: Negative for chest pain.  Musculoskeletal: Negative for joint swelling.  Skin: Positive for wound. Negative for rash.  Neurological: Negative for numbness.     Physical Exam Updated Vital Signs BP (!) 156/99 (BP Location: Right Arm)   Pulse 91   Temp 98.7 F (37.1 C) (Oral)   Resp 18   Ht 5\' 8"  (1.727 m)   Wt 73.9 kg (163 lb)   SpO2 99%   BMI 24.78 kg/m   Physical Exam  Constitutional: He is oriented to person, place, and time. He appears well-developed and well-nourished.  HENT:    Head: Normocephalic and atraumatic.  Eyes: Conjunctivae are normal.  Neck: Normal range of motion.  Cardiovascular: Normal rate.  Pulmonary/Chest: Effort normal. No respiratory distress.  Musculoskeletal: Normal range of motion.  Examination of the left hand shows 2 volar lacerations one on the volar aspect of the DIP joint of the index finger and the second on the volar aspect of the DIP joint of the ring finger.  Patient has full flexion of the index and ring fingers.  No deficits are noted.  Sensation is intact distally.  There is no visible or palpable foreign body.  Neurological: He is alert and oriented to person, place, and time.  Skin: Skin is warm. No rash noted.  Psychiatric: He has a normal mood and affect. His behavior is normal. Thought content normal.     ED Treatments / Results  Labs (all labs ordered are listed, but only abnormal results are displayed) Labs Reviewed - No data to display  EKG None  Radiology No results found.  Procedures .Marland KitchenLaceration Repair Date/Time: 05/30/2017 5:23 PM Performed by: Duanne Guess, PA-C Authorized by: Duanne Guess, PA-C   Consent:    Consent obtained:  Verbal   Consent given by:  Patient   Risks discussed:  Infection and pain   Alternatives discussed:  No treatment Anesthesia (see MAR for exact  dosages):    Anesthesia method:  None Laceration details:    Location:  Finger   Finger location:  L index finger (And left ring finger)   Length (cm):  4   Depth (mm):  2 Repair type:    Repair type:  Simple Pre-procedure details:    Preparation:  Patient was prepped and draped in usual sterile fashion Exploration:    Contaminated: no   Treatment:    Area cleansed with:  Betadine   Amount of cleaning:  Standard   Irrigation solution:  Sterile saline   Visualized foreign bodies/material removed: no   Skin repair:    Repair method:  Sutures   Suture size:  5-0   Suture material:  Nylon   Number of sutures:   8 Approximation:    Approximation:  Close Post-procedure details:    Dressing:  Non-adherent dressing   Patient tolerance of procedure:  Tolerated well, no immediate complications   (including critical care time)  Medications Ordered in ED Medications  lidocaine (PF) (XYLOCAINE) 1 % injection 5 mL (has no administration in time range)  Tdap (BOOSTRIX) injection 0.5 mL (has no administration in time range)  lidocaine (PF) (XYLOCAINE) 1 % injection (has no administration in time range)     Initial Impression / Assessment and Plan / ED Course  I have reviewed the triage vital signs and the nursing notes.  Pertinent labs & imaging results that were available during my care of the patient were reviewed by me and considered in my medical decision making (see chart for details).     32 year old male with lacerations to the left index and ring fingers.  Lacerations were thoroughly irrigated and repaired with sutures.  No neurological or tendon deficits noted.  Patient is educated on signs symptoms return to the ED for such as any redness, drainage, warmth, fevers.  He will follow-up in 8 to 10 days for suture removal.  Tetanus was updated today in the ED.  Final Clinical Impressions(s) / ED Diagnoses   Final diagnoses:  Laceration of left index finger without foreign body without damage to nail, initial encounter  Laceration of left ring finger without foreign body without damage to nail, initial encounter    ED Discharge Orders    None       Renata Caprice 05/30/17 1727    Eula Listen, MD 05/31/17 0006

## 2017-05-30 NOTE — ED Notes (Signed)
The patients hand was soaked in a normal saline solution for a few minutes.

## 2017-05-30 NOTE — Discharge Instructions (Addendum)
Please keep laceration sites clean.  Keep laceration sites covered.  He may shower but do not submerge underwater.  Avoid direct pressure and heavy lifting over the laceration site.  Return to the ER for any redness, warmth, swelling.  Follow-up with primary care provider or walk-in clinic in 8 to 10 days for suture removal

## 2017-06-06 ENCOUNTER — Ambulatory Visit (INDEPENDENT_AMBULATORY_CARE_PROVIDER_SITE_OTHER): Payer: Medicaid Other | Admitting: Unknown Physician Specialty

## 2017-06-06 ENCOUNTER — Encounter: Payer: Self-pay | Admitting: Unknown Physician Specialty

## 2017-06-06 VITALS — BP 114/71 | HR 58 | Temp 97.7°F | Ht 68.3 in | Wt 162.8 lb

## 2017-06-06 DIAGNOSIS — Z4802 Encounter for removal of sutures: Secondary | ICD-10-CM | POA: Diagnosis not present

## 2017-06-06 NOTE — Progress Notes (Signed)
BP 114/71   Pulse (!) 58   Temp 97.7 F (36.5 C) (Oral)   Ht 5' 8.3" (1.735 m)   Wt 162 lb 12.8 oz (73.8 kg)   SpO2 97%   BMI 24.54 kg/m    Subjective:    Patient ID: Danny Valdez, male    DOB: 05-Aug-1985, 32 y.o.   MRN: [id number]  HPI: Danny Valdez is a 32 y.o. male  Chief Complaint  Patient presents with  . Suture / Staple Removal    pt states he went to [facility] last week and had 8 stitches put in his fingers, states 4 have already come out and need 4 taken out   Pt states he was moving a freezer and cut his hand.  Bleeding profusely and 8 stitches already came out after washing dishes.  Laceration healing well  Relevant past medical, surgical, family and social history reviewed and updated as indicated. Interim medical history since our last visit reviewed. Allergies and medications reviewed and updated.  Review of Systems  Per HPI unless specifically indicated above     Objective:    BP 114/71   Pulse (!) 58   Temp 97.7 F (36.5 C) (Oral)   Ht 5' 8.3" (1.735 m)   Wt 162 lb 12.8 oz (73.8 kg)   SpO2 97%   BMI 24.54 kg/m   Wt Readings from Last 3 Encounters:  06/06/17 162 lb 12.8 oz (73.8 kg)  12/25/15 140 lb 9.6 oz (63.8 kg)  12/07/15 139 lb (63 kg)    Physical Exam  Constitutional: He is oriented to person, place, and time. He appears well-developed and well-nourished. No distress.  HENT:  Head: Normocephalic and atraumatic.  Eyes: Conjunctivae and lids are normal. Right eye exhibits no discharge. Left eye exhibits no discharge. No scleral icterus.  Pulmonary/Chest: Effort normal.  Abdominal: Normal appearance. There is no splenomegaly or hepatomegaly.  Musculoskeletal: Normal range of motion.  Neurological: He is alert and oriented to person, place, and time.  Skin: Skin is intact. No rash noted. No pallor.  3 sutures removed from left  index and 1 from left ring finger.  Wound well approximated.  Covered with polysporin ointment and bandaid    Psychiatric: He has a normal mood and affect. His behavior is normal. Judgment and thought content normal.    Results for orders placed or performed in visit on 12/25/15  Microscopic Examination  Result Value Ref Range   WBC, UA None seen 0 - 5 /hpf   RBC, UA 0-2 0 - 2 /hpf   Epithelial Cells (non renal) 0-10 0 - 10 /hpf  CBC with Differential/Platelet  Result Value Ref Range   WBC 4.0 3.4 - 10.8 x10E3/uL   RBC 4.40 4.14 - 5.80 x10E6/uL   Hemoglobin 13.5 13.0 - 17.7 g/dL   Hematocrit 40.9 81.1 - 51.0 %   MCV 93 79 - 97 fL   MCH 30.7 26.6 - 33.0 pg   MCHC 33.2 31.5 - 35.7 g/dL   RDW 91.4 (L) 78.2 - 95.6 %   Platelets 290 150 - 379 x10E3/uL   Neutrophils 34 Not Estab. %   Lymphs 46 Not Estab. %   Monocytes 10 Not Estab. %   Eos 8 Not Estab. %   Basos 1 Not Estab. %   Neutrophils Absolute 1.4 1.4 - 7.0 x10E3/uL   Lymphocytes Absolute 1.9 0.7 - 3.1 x10E3/uL   Monocytes Absolute 0.4 0.1 - 0.9 x10E3/uL   EOS (ABSOLUTE) 0.3  0.0 - 0.4 x10E3/uL   Basophils Absolute 0.0 0.0 - 0.2 x10E3/uL   Immature Granulocytes 1 Not Estab. %   Immature Grans (Abs) 0.0 0.0 - 0.1 x10E3/uL  Comprehensive metabolic panel  Result Value Ref Range   Glucose 72 65 - 99 mg/dL   BUN 11 6 - 20 mg/dL   Creatinine, Ser 5.78 0.76 - 1.27 mg/dL   GFR calc non Af Amer 112 >59 mL/min/1.73   GFR calc Af Amer 129 >59 mL/min/1.73   BUN/Creatinine Ratio 12 9 - 20   Sodium 140 134 - 144 mmol/L   Potassium 4.2 3.5 - 5.2 mmol/L   Chloride 100 96 - 106 mmol/L   CO2 25 18 - 29 mmol/L   Calcium 9.6 8.7 - 10.2 mg/dL   Total Protein 7.5 6.0 - 8.5 g/dL   Albumin 4.6 3.5 - 5.5 g/dL   Globulin, Total 2.9 1.5 - 4.5 g/dL   Albumin/Globulin Ratio 1.6 1.2 - 2.2   Bilirubin Total 1.5 (H) 0.0 - 1.2 mg/dL   Alkaline Phosphatase 48 39 - 117 IU/L   AST 17 0 - 40 IU/L   ALT 8 0 - 44 IU/L  Lipid Panel w/o Chol/HDL Ratio  Result Value Ref Range   Cholesterol, Total 165 100 - 199 mg/dL   Triglycerides 469 0 - 149 mg/dL   HDL 58  >62 mg/dL   VLDL Cholesterol Cal 26 5 - 40 mg/dL   LDL Calculated 81 0 - 99 mg/dL  TSH  Result Value Ref Range   TSH 0.798 0.450 - 4.500 uIU/mL  UA/M w/rflx Culture, Routine  Result Value Ref Range   Specific Gravity, UA 1.020 1.005 - 1.030   pH, UA 6.0 5.0 - 7.5   Color, UA Yellow Yellow   Appearance Ur Clear Clear   Leukocytes, UA Negative Negative   Protein, UA Negative Negative/Trace   Glucose, UA Negative Negative   Ketones, UA Negative Negative   RBC, UA Trace (A) Negative   Bilirubin, UA Negative Negative   Urobilinogen, Ur 0.2 0.2 - 1.0 mg/dL   Nitrite, UA Negative Negative   Microscopic Examination See below:       Assessment & Plan:   Problem List Items Addressed This Visit    None    Visit Diagnoses    Visit for suture removal    -  Primary   wound with well approximated edges.  Dressed with neosporin and a bandaid       Follow up plan: Return if symptoms worsen or fail to improve.

## 2018-06-25 ENCOUNTER — Emergency Department: Payer: No Typology Code available for payment source

## 2018-06-25 ENCOUNTER — Other Ambulatory Visit: Payer: Self-pay

## 2018-06-25 ENCOUNTER — Encounter: Payer: Self-pay | Admitting: Intensive Care

## 2018-06-25 ENCOUNTER — Emergency Department
Admission: EM | Admit: 2018-06-25 | Discharge: 2018-06-25 | Disposition: A | Discharge status: Home / Self Care | Payer: No Typology Code available for payment source | Attending: Emergency Medicine | Admitting: Emergency Medicine

## 2018-06-25 DIAGNOSIS — Y9389 Activity, other specified: Secondary | ICD-10-CM | POA: Insufficient documentation

## 2018-06-25 DIAGNOSIS — Z87891 Personal history of nicotine dependence: Secondary | ICD-10-CM | POA: Insufficient documentation

## 2018-06-25 DIAGNOSIS — Y999 Unspecified external cause status: Secondary | ICD-10-CM | POA: Diagnosis not present

## 2018-06-25 DIAGNOSIS — M549 Dorsalgia, unspecified: Secondary | ICD-10-CM | POA: Diagnosis not present

## 2018-06-25 DIAGNOSIS — S299XXA Unspecified injury of thorax, initial encounter: Secondary | ICD-10-CM | POA: Diagnosis not present

## 2018-06-25 DIAGNOSIS — M7918 Myalgia, other site: Secondary | ICD-10-CM | POA: Insufficient documentation

## 2018-06-25 DIAGNOSIS — S199XXA Unspecified injury of neck, initial encounter: Secondary | ICD-10-CM | POA: Diagnosis not present

## 2018-06-25 DIAGNOSIS — S20211A Contusion of right front wall of thorax, initial encounter: Secondary | ICD-10-CM | POA: Diagnosis not present

## 2018-06-25 DIAGNOSIS — R0781 Pleurodynia: Secondary | ICD-10-CM | POA: Diagnosis not present

## 2018-06-25 DIAGNOSIS — Y92410 Unspecified street and highway as the place of occurrence of the external cause: Secondary | ICD-10-CM | POA: Insufficient documentation

## 2018-06-25 LAB — URINALYSIS, COMPLETE (UACMP) WITH MICROSCOPIC
Bacteria, UA: NONE SEEN
Bilirubin Urine: NEGATIVE
Glucose, UA: NEGATIVE mg/dL
Hgb urine dipstick: NEGATIVE
Ketones, ur: NEGATIVE mg/dL
Leukocytes,Ua: NEGATIVE
Nitrite: NEGATIVE
Protein, ur: NEGATIVE mg/dL
Specific Gravity, Urine: 1.019 (ref 1.005–1.030)
Squamous Epithelial / HPF: NONE SEEN (ref 0–5)
pH: 5 (ref 5.0–8.0)

## 2018-06-25 MED ORDER — MELOXICAM 15 MG PO TABS: 15.0000 mg | ORAL_TABLET | Freq: Every day | ORAL | 2 refills | Status: DC

## 2018-06-25 MED ORDER — BACLOFEN 10 MG PO TABS: 10.0000 mg | ORAL_TABLET | Freq: Three times a day (TID) | ORAL | 1 refills | Status: DC

## 2018-06-25 NOTE — Discharge Instructions (Addendum)
Follow-up with your regular doctor in acute care if not better in 3 to 5 days.  Take medication as prescribed.  Return emergency department worsening.  Apply ice to all areas that hurt.
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
## 2018-06-25 NOTE — ED Notes (Signed)
Pt laying in bed speaking with this RN in NAD, pt reports pain 10/10 in neck and left shoulder
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
## 2018-06-26 ENCOUNTER — Encounter: Payer: Self-pay | Admitting: Unknown Physician Specialty

## 2018-07-02 ENCOUNTER — Ambulatory Visit: Payer: Self-pay | Admitting: Family Medicine

## 2018-07-02 ENCOUNTER — Other Ambulatory Visit: Payer: Self-pay

## 2018-07-03 ENCOUNTER — Ambulatory Visit (INDEPENDENT_AMBULATORY_CARE_PROVIDER_SITE_OTHER): Payer: Self-pay | Admitting: Family Medicine

## 2018-07-03 ENCOUNTER — Other Ambulatory Visit: Payer: Self-pay

## 2018-07-03 ENCOUNTER — Encounter: Payer: Self-pay | Admitting: Family Medicine

## 2018-07-03 VITALS — BP 115/73 | HR 65 | Temp 98.5°F | Ht 68.3 in | Wt 143.0 lb

## 2018-07-03 DIAGNOSIS — F331 Major depressive disorder, recurrent, moderate: Secondary | ICD-10-CM

## 2018-07-03 DIAGNOSIS — M549 Dorsalgia, unspecified: Secondary | ICD-10-CM

## 2018-07-03 DIAGNOSIS — M542 Cervicalgia: Secondary | ICD-10-CM

## 2018-07-03 DIAGNOSIS — F329 Major depressive disorder, single episode, unspecified: Secondary | ICD-10-CM | POA: Insufficient documentation

## 2018-07-03 DIAGNOSIS — F32A Depression, unspecified: Secondary | ICD-10-CM | POA: Insufficient documentation

## 2018-07-03 DIAGNOSIS — G4700 Insomnia, unspecified: Secondary | ICD-10-CM

## 2018-07-03 MED ORDER — TRAZODONE HCL 50 MG PO TABS: 25.0000 mg | ORAL_TABLET | Freq: Every day | ORAL | 0 refills | Status: DC

## 2018-07-03 NOTE — Assessment & Plan Note (Signed)
Will start trazodone for moods and sleep and monitor closely for benefit. Recheck in 1 month
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
## 2018-07-22 ENCOUNTER — Telehealth: Payer: Self-pay

## 2018-07-22 DIAGNOSIS — M546 Pain in thoracic spine: Secondary | ICD-10-CM
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
## 2018-07-23 NOTE — Telephone Encounter (Signed)
Referral placed.

## 2018-07-27 NOTE — Telephone Encounter (Signed)
Attempted to reach pt. Unable to leave vm

## 2018-07-28 NOTE — Telephone Encounter (Signed)
Message relayed to patient. Verbalized understanding and denied questions.   

## 2018-08-07 ENCOUNTER — Encounter: Payer: Self-pay | Admitting: Family Medicine

## 2018-08-07 ENCOUNTER — Other Ambulatory Visit: Payer: Self-pay

## 2018-08-07 ENCOUNTER — Ambulatory Visit: Payer: Medicaid Other | Admitting: Family Medicine

## 2018-08-07 VITALS — BP 111/76 | HR 47 | Temp 98.1°F | Ht 68.3 in | Wt 145.0 lb

## 2018-08-07 DIAGNOSIS — G4700 Insomnia, unspecified: Secondary | ICD-10-CM | POA: Diagnosis not present

## 2018-08-07 MED ORDER — TRAZODONE HCL 50 MG PO TABS: 25.0000 mg | ORAL_TABLET | Freq: Every day | ORAL | 2 refills | Status: DC

## 2018-08-07 NOTE — Progress Notes (Signed)
BP 111/76   Pulse (!) 47   Temp 98.1 F (36.7 C) (Oral)   Ht 5' 8.3" (1.735 m)   Wt 145 lb (65.8 kg)   SpO2 98%   BMI 21.85 kg/m    Subjective:    Patient ID: Danny Valdez, male    DOB: 10/10/1985, 33 y.o.   MRN: [id number]  HPI: Danny Valdez is a 33 y.o. male  Chief Complaint  Patient presents with  . Insomnia    4w f/u  . Depression   Patient here today for 1 month insomnia follow up. Started on trazodone and taking prn for sleep. Feels it helps quite a bit when he needs it. Denies side effects, daytime sedation, mood concerns.   [facility] 08/07/2018 12/25/2015 10/14/2014  Decreased Interest 0 0 0  Down, Depressed, Hopeless 0 0 0  PHQ - 2 Score 0 0 0  Altered sleeping 0 1 -  Tired, decreased energy 0 0 -  Change in appetite 0 0 -  Feeling bad or failure about yourself  0 0 -  Trouble concentrating 0 0 -  Moving slowly or fidgety/restless 0 0 -  Suicidal thoughts 0 0 -  PHQ-9 Score 0 1 -    Relevant past medical, surgical, family and social history reviewed and updated as indicated. Interim medical history since our last visit reviewed. Allergies and medications reviewed and updated.  Review of Systems  Per HPI unless specifically indicated above     Objective:    BP 111/76   Pulse (!) 47   Temp 98.1 F (36.7 C) (Oral)   Ht 5' 8.3" (1.735 m)   Wt 145 lb (65.8 kg)   SpO2 98%   BMI 21.85 kg/m   Wt Readings from Last 3 Encounters:  08/07/18 145 lb (65.8 kg)  07/03/18 143 lb (64.9 kg)  06/25/18 150 lb (68 kg)    Physical Exam Vitals signs and nursing note reviewed.  Constitutional:      Appearance: Normal appearance.  HENT:     Head: Atraumatic.  Eyes:     Extraocular Movements: Extraocular movements intact.     Conjunctiva/sclera: Conjunctivae normal.  Neck:     Musculoskeletal: Normal range of motion and neck supple.  Cardiovascular:     Rate and Rhythm: Normal rate and regular rhythm.  Pulmonary:     Effort: Pulmonary  effort is normal.     Breath sounds: Normal breath sounds.  Musculoskeletal: Normal range of motion.  Skin:    General: Skin is warm and dry.  Neurological:     General: No focal deficit present.     Mental Status: He is oriented to person, place, and time.  Psychiatric:        Mood and Affect: Mood normal.        Thought Content: Thought content normal.        Judgment: Judgment normal.     Results for orders placed or performed during the hospital encounter of 06/25/18  Urinalysis, Complete w Microscopic  Result Value Ref Range   Color, Urine YELLOW (A) YELLOW   APPearance CLEAR (A) CLEAR   Specific Gravity, Urine 1.019 1.005 - 1.030   pH 5.0 5.0 - 8.0   Glucose, UA NEGATIVE NEGATIVE mg/dL   Hgb urine dipstick NEGATIVE NEGATIVE   Bilirubin Urine NEGATIVE NEGATIVE   Ketones, ur NEGATIVE NEGATIVE mg/dL   Protein, ur NEGATIVE NEGATIVE mg/dL   Nitrite NEGATIVE NEGATIVE   Leukocytes,Ua NEGATIVE NEGATIVE  WBC, UA 0-5 0 - 5 WBC/hpf   Bacteria, UA NONE SEEN NONE SEEN   Squamous Epithelial / LPF NONE SEEN 0 - 5   Mucus PRESENT       Assessment & Plan:   Problem List Items Addressed This Visit    None    Visit Diagnoses    Insomnia, unspecified type    -  Primary   Stable on prn trazodone. Continue current regimen and good sleep hygiene       Follow up plan: Return for CPE.

## 2018-10-02 ENCOUNTER — Encounter: Payer: Self-pay | Admitting: Family Medicine

## 2018-10-02 ENCOUNTER — Other Ambulatory Visit: Payer: Self-pay

## 2018-10-02 ENCOUNTER — Ambulatory Visit: Payer: Medicaid Other | Admitting: Family Medicine

## 2018-10-02 VITALS — BP 115/79 | HR 55 | Temp 98.2°F | Ht 69.0 in | Wt 141.0 lb

## 2018-10-02 DIAGNOSIS — Z23 Encounter for immunization: Secondary | ICD-10-CM | POA: Diagnosis not present

## 2018-10-02 DIAGNOSIS — J069 Acute upper respiratory infection, unspecified: Secondary | ICD-10-CM | POA: Diagnosis not present

## 2018-10-02 NOTE — Progress Notes (Signed)
BP 115/79   Pulse (!) 55   Temp 98.2 F (36.8 C) (Oral)   Ht 5\' 9"  (1.753 m)   Wt 141 lb (64 kg)   SpO2 99%   BMI 20.82 kg/m    Subjective:    Patient ID: Danny Valdez, male    DOB: 1985-11-23, 33 y.o.   MRN: [id number]  HPI: Danny Valdez is a 33 y.o. male  Chief Complaint  Patient presents with  . Follow-up    work note   Patient with sick sxs since last Tuesday until this past Wednesday, about 8 days total. Was having runny nose, cough, post nasal drainage, chest tightness. Denies fevers, chills, CP, SOB. Feels completely back to normal now the past 2 days. No lingering sxs. Needs note to return to work since feeling better. Did not get COVID 19 tested as he did not feel this was necessary so just quarantined himself at home the past 10 days.   Relevant past medical, surgical, family and social history reviewed and updated as indicated. Interim medical history since our last visit reviewed. Allergies and medications reviewed and updated.  Review of Systems  Per HPI unless specifically indicated above     Objective:    BP 115/79   Pulse (!) 55   Temp 98.2 F (36.8 C) (Oral)   Ht 5\' 9"  (1.753 m)   Wt 141 lb (64 kg)   SpO2 99%   BMI 20.82 kg/m   Wt Readings from Last 3 Encounters:  10/02/18 141 lb (64 kg)  08/07/18 145 lb (65.8 kg)  07/03/18 143 lb (64.9 kg)    Physical Exam Vitals signs and nursing note reviewed.  Constitutional:      Appearance: Normal appearance.  HENT:     Head: Atraumatic.  Eyes:     Extraocular Movements: Extraocular movements intact.     Conjunctiva/sclera: Conjunctivae normal.  Neck:     Musculoskeletal: Normal range of motion and neck supple.  Cardiovascular:     Rate and Rhythm: Normal rate and regular rhythm.  Pulmonary:     Effort: Pulmonary effort is normal.     Breath sounds: Normal breath sounds.  Musculoskeletal: Normal range of motion.  Skin:    General: Skin is warm and dry.  Neurological:     General: No  focal deficit present.     Mental Status: Mental status is at baseline.  Psychiatric:        Mood and Affect: Mood normal.        Thought Content: Thought content normal.        Judgment: Judgment normal.     Results for orders placed or performed during the hospital encounter of 06/25/18  Urinalysis, Complete w Microscopic  Result Value Ref Range   Color, Urine YELLOW (A) YELLOW   APPearance CLEAR (A) CLEAR   Specific Gravity, Urine 1.019 1.005 - 1.030   pH 5.0 5.0 - 8.0   Glucose, UA NEGATIVE NEGATIVE mg/dL   Hgb urine dipstick NEGATIVE NEGATIVE   Bilirubin Urine NEGATIVE NEGATIVE   Ketones, ur NEGATIVE NEGATIVE mg/dL   Protein, ur NEGATIVE NEGATIVE mg/dL   Nitrite NEGATIVE NEGATIVE   Leukocytes,Ua NEGATIVE NEGATIVE   WBC, UA 0-5 0 - 5 WBC/hpf   Bacteria, UA NONE SEEN NONE SEEN   Squamous Epithelial / LPF NONE SEEN 0 - 5   Mucus PRESENT       Assessment & Plan:   Problem List Items Addressed This Visit  None    Visit Diagnoses    Upper respiratory tract infection, unspecified type    -  Primary   Unclear if COVID 19 related, but resolved and meeting criteria to return to work. Work note provided to patient. Call if sxs return   Flu vaccine need       Relevant Orders   Flu Vaccine QUAD 36+ mos IM (Completed)       Follow up plan: Return if symptoms worsen or fail to improve.

## 2018-10-28 ENCOUNTER — Other Ambulatory Visit: Payer: Self-pay

## 2018-10-28 ENCOUNTER — Encounter: Payer: Medicaid Other | Admitting: Family Medicine

## 2019-05-24 ENCOUNTER — Other Ambulatory Visit: Payer: Self-pay

## 2019-05-24 ENCOUNTER — Ambulatory Visit: Payer: Medicaid Other | Admitting: Family Medicine

## 2019-05-24 ENCOUNTER — Ambulatory Visit (INDEPENDENT_AMBULATORY_CARE_PROVIDER_SITE_OTHER): Payer: Medicaid Other | Admitting: Family Medicine

## 2019-05-24 ENCOUNTER — Encounter: Payer: Self-pay | Admitting: Family Medicine

## 2019-05-24 VITALS — BP 105/66 | HR 61 | Temp 97.8°F | Ht 69.5 in | Wt 141.2 lb

## 2019-05-24 DIAGNOSIS — Z0000 Encounter for general adult medical examination without abnormal findings: Secondary | ICD-10-CM | POA: Diagnosis not present

## 2019-05-24 DIAGNOSIS — Z113 Encounter for screening for infections with a predominantly sexual mode of transmission: Secondary | ICD-10-CM

## 2019-05-24 DIAGNOSIS — S069X0S Unspecified intracranial injury without loss of consciousness, sequela: Secondary | ICD-10-CM

## 2019-05-24 LAB — UA/M W/RFLX CULTURE, ROUTINE
Bilirubin, UA: NEGATIVE
Glucose, UA: NEGATIVE
Leukocytes,UA: NEGATIVE
Nitrite, UA: NEGATIVE
RBC, UA: NEGATIVE
Specific Gravity, UA: 1.02 (ref 1.005–1.030)
Urobilinogen, Ur: 2 mg/dL — ABNORMAL HIGH (ref 0.2–1.0)
pH, UA: 7 (ref 5.0–7.5)

## 2019-05-24 LAB — MICROSCOPIC EXAMINATION
Bacteria, UA: NONE SEEN
RBC, Urine: NONE SEEN /hpf (ref 0–2)
WBC, UA: NONE SEEN /hpf (ref 0–5)

## 2019-05-24 NOTE — Assessment & Plan Note (Signed)
Stable. Continues to have help. No concerns.

## 2019-05-24 NOTE — Patient Instructions (Signed)

## 2019-05-24 NOTE — Progress Notes (Signed)
BP 105/66 (BP Location: Left Arm, Patient Position: Sitting, Cuff Size: Normal)   Pulse 61   Temp 97.8 F (36.6 C) (Oral)   Ht 5' 9.5" (1.765 m)   Wt 141 lb 3.2 oz (64 kg)   SpO2 97%   BMI 20.55 kg/m    Subjective:    Patient ID: Danny Valdez, male    DOB: [birth year], 34 y.o.   MRN: [id number]  HPI: Danny Valdez is a 34 y.o. male presenting on 05/24/2019 for comprehensive medical examination. Current medical complaints include:none  Interim Problems from his last visit: no  Depression Screen done today and results listed below:  Depression screen [facility] 2/9 05/24/2019 08/07/2018 12/25/2015 10/14/2014  Decreased Interest 0 0 0 0  Down, Depressed, Hopeless 0 0 0 0  PHQ - 2 Score 0 0 0 0  Altered sleeping 0 0 1 -  Tired, decreased energy 0 0 0 -  Change in appetite 0 0 0 -  Feeling bad or failure about yourself  1 0 0 -  Trouble concentrating 0 0 0 -  Moving slowly or fidgety/restless 0 0 0 -  Suicidal thoughts 0 0 0 -  PHQ-9 Score 1 0 1 -  Difficult doing work/chores Not difficult at all - - -    Past Medical History:  Past Medical History:  Diagnosis Date  . ADHD (attention deficit hyperactivity disorder)   . Allergy   . Anxiety   . Brain damage   . Depression   . Seizures (HCC)     Surgical History:  No past surgical history on file.  Medications:  No current outpatient medications on file prior to visit.   No current facility-administered medications on file prior to visit.    Allergies:  No Known Allergies  Social History:  Social History   Socioeconomic History  . Marital status: Single    Spouse name: Not on file  . Number of children: Not on file  . Years of education: Not on file  . Highest education level: Not on file  Occupational History  . Not on file  Tobacco Use  . Smoking status: Former Smoker    Quit date: 04/05/2014    Years since quitting: 5.1  . Smokeless tobacco: Never Used  Substance and Sexual Activity  . Alcohol use: Yes    Alcohol/week: 4.0 standard drinks    Types: 4 Cans of beer per week    Comment: Occasionally/Rarely  . Drug use: Never  . Sexual activity: Never  Other Topics Concern  . Not on file  Social History Narrative   ** Merged History Encounter **       Social Determinants of Health   Financial Resource Strain:   . Difficulty of Paying Living Expenses:   Food Insecurity:   . Worried About Programme researcher, broadcasting/film/video in the Last Year:   . Barista in the Last Year:   Transportation Needs:   . Freight forwarder (Medical):   Marland Kitchen Lack of Transportation (Non-Medical):   Physical Activity:   . Days of Exercise per Week:   . Minutes of Exercise per Session:   Stress:   . Feeling of Stress :   Social Connections:   . Frequency of Communication with Friends and Family:   . Frequency of Social Gatherings with Friends and Family:   . Attends Religious Services:   . Active Member of Clubs or Organizations:   . Attends Banker Meetings:   .  Marital Status:   Intimate Partner Violence:   . Fear of Current or Ex-Partner:   . Emotionally Abused:   Marland Kitchen Physically Abused:   . Sexually Abused:    Social History   Tobacco Use  Smoking Status Former Smoker  . Quit date: 04/05/2014  . Years since quitting: 5.1  Smokeless Tobacco Never Used   Social History   Substance and Sexual Activity  Alcohol Use Yes  . Alcohol/week: 4.0 standard drinks  . Types: 4 Cans of beer per week   Comment: Occasionally/Rarely    Family History:  Family History  Problem Relation Age of Onset  . Hypertension Father   . Diabetes Maternal Grandmother   . Hypertension Maternal Grandmother   . Diabetes Paternal Grandmother   . Gout Paternal Grandfather   . Diabetes Paternal Grandfather   . Hypertension Paternal Grandfather   . Aneurysm Paternal Grandfather     Past medical history, surgical history, medications, allergies, family history and social history reviewed with patient today and  changes made to appropriate areas of the chart.   Review of Systems  Constitutional: Negative.   HENT: Negative.   Eyes: Negative.   Respiratory: Negative.   Cardiovascular: Negative.   Gastrointestinal: Negative.   Genitourinary: Negative.   Musculoskeletal: Positive for back pain and myalgias. Negative for falls, joint pain and neck pain.  Skin: Negative.   Neurological: Negative.   Endo/Heme/Allergies: Positive for environmental allergies. Negative for polydipsia. Does not bruise/bleed easily.  Psychiatric/Behavioral: Negative.     All other ROS negative except what is listed above and in the HPI.      Objective:    BP 105/66 (BP Location: Left Arm, Patient Position: Sitting, Cuff Size: Normal)   Pulse 61   Temp 97.8 F (36.6 C) (Oral)   Ht 5' 9.5" (1.765 m)   Wt 141 lb 3.2 oz (64 kg)   SpO2 97%   BMI 20.55 kg/m   Wt Readings from Last 3 Encounters:  05/24/19 141 lb 3.2 oz (64 kg)  10/02/18 141 lb (64 kg)  08/07/18 145 lb (65.8 kg)    Physical Exam Vitals and nursing note reviewed.  Constitutional:      General: He is not in acute distress.    Appearance: Normal appearance. He is normal weight. He is not ill-appearing, toxic-appearing or diaphoretic.  HENT:     Head: Normocephalic and atraumatic.     Right Ear: Tympanic membrane, ear canal and external ear normal. There is no impacted cerumen.     Left Ear: Tympanic membrane, ear canal and external ear normal. There is no impacted cerumen.     Nose: Nose normal. No congestion or rhinorrhea.     Mouth/Throat:     Mouth: Mucous membranes are moist.     Pharynx: Oropharynx is clear. No oropharyngeal exudate or posterior oropharyngeal erythema.  Eyes:     General: No scleral icterus.       Right eye: No discharge.        Left eye: No discharge.     Extraocular Movements: Extraocular movements intact.     Conjunctiva/sclera: Conjunctivae normal.     Pupils: Pupils are equal, round, and reactive to light.  Neck:       Vascular: No carotid bruit.  Cardiovascular:     Rate and Rhythm: Normal rate and regular rhythm.     Pulses: Normal pulses.     Heart sounds: No murmur. No friction rub. No gallop.   Pulmonary:  Effort: Pulmonary effort is normal. No respiratory distress.     Breath sounds: Normal breath sounds. No stridor. No wheezing, rhonchi or rales.  Chest:     Chest wall: No tenderness.  Abdominal:     General: Abdomen is flat. Bowel sounds are normal. There is no distension.     Palpations: Abdomen is soft. There is no mass.     Tenderness: There is no abdominal tenderness. There is no right CVA tenderness, left CVA tenderness, guarding or rebound.     Hernia: No hernia is present.  Genitourinary:    Comments: Genital exam deferred with shared decision making Musculoskeletal:        General: No swelling, tenderness, deformity or signs of injury.     Cervical back: Normal range of motion and neck supple. No rigidity. No muscular tenderness.     Right lower leg: No edema.     Left lower leg: No edema.  Lymphadenopathy:     Cervical: No cervical adenopathy.  Skin:    General: Skin is warm and dry.     Capillary Refill: Capillary refill takes less than 2 seconds.     Coloration: Skin is not jaundiced or pale.     Findings: No bruising, erythema, lesion or rash.  Neurological:     General: No focal deficit present.     Mental Status: He is alert and oriented to person, place, and time.     Cranial Nerves: No cranial nerve deficit.     Sensory: No sensory deficit.     Motor: No weakness.     Coordination: Coordination normal.     Gait: Gait normal.     Deep Tendon Reflexes: Reflexes normal.  Psychiatric:        Mood and Affect: Mood normal.        Behavior: Behavior normal.        Thought Content: Thought content normal.        Judgment: Judgment normal.     Results for orders placed or performed during the hospital encounter of 06/25/18  Urinalysis, Complete w Microscopic   Result Value Ref Range   Color, Urine YELLOW (A) YELLOW   APPearance CLEAR (A) CLEAR   Specific Gravity, Urine 1.019 1.005 - 1.030   pH 5.0 5.0 - 8.0   Glucose, UA NEGATIVE NEGATIVE mg/dL   Hgb urine dipstick NEGATIVE NEGATIVE   Bilirubin Urine NEGATIVE NEGATIVE   Ketones, ur NEGATIVE NEGATIVE mg/dL   Protein, ur NEGATIVE NEGATIVE mg/dL   Nitrite NEGATIVE NEGATIVE   Leukocytes,Ua NEGATIVE NEGATIVE   WBC, UA 0-5 0 - 5 WBC/hpf   Bacteria, UA NONE SEEN NONE SEEN   Squamous Epithelial / LPF NONE SEEN 0 - 5   Mucus PRESENT       Assessment & Plan:   Problem List Items Addressed This Visit      Nervous and Auditory   TBI (traumatic brain injury) (HCC)    Stable. Continues to have help. No concerns.        Other Visit Diagnoses    Routine general medical examination at a health care facility    -  Primary   Vaccines up to date. Screening labs checked today. Continue diet and exercise. Call with any concerns. Continue to monitor.    Relevant Orders   CBC with Differential/Platelet   Comprehensive metabolic panel   Lipid Panel w/o Chol/HDL Ratio   TSH   UA/M w/rflx Culture, Routine   Routine screening for STI (sexually transmitted  infection)       Labs drawn today. Await results.    Relevant Orders   HIV Antibody (routine testing w rflx)   GC/Chlamydia Probe Amp   RPR   HSV(herpes simplex vrs) 1+2 ab-IgG   Hepatitis, Acute      LABORATORY TESTING:  Health maintenance labs ordered today as discussed above.   IMMUNIZATIONS:   - Tdap: Tetanus vaccination status reviewed: last tetanus booster within 10 years. - Influenza: Up to date - Pneumovax: Not applicable  PATIENT COUNSELING:    Sexuality: Discussed sexually transmitted diseases, partner selection, use of condoms, avoidance of unintended pregnancy  and contraceptive alternatives.   Advised to avoid cigarette smoking.  I discussed with the patient that most people either abstain from alcohol or drink within safe  limits (<=14/week and <=4 drinks/occasion for males, <=7/weeks and <= 3 drinks/occasion for females) and that the risk for alcohol disorders and other health effects rises proportionally with the number of drinks per week and how often a drinker exceeds daily limits.  Discussed cessation/primary prevention of drug use and availability of treatment for abuse.   Diet: Encouraged to adjust caloric intake to maintain  or achieve ideal body weight, to reduce intake of dietary saturated fat and total fat, to limit sodium intake by avoiding high sodium foods and not adding table salt, and to maintain adequate dietary potassium and calcium preferably from fresh fruits, vegetables, and low-fat dairy products.    stressed the importance of regular exercise  Injury prevention: Discussed safety belts, safety helmets, smoke detector, smoking near bedding or upholstery.   Dental health: Discussed importance of regular tooth brushing, flossing, and dental visits.   Follow up plan: NEXT PREVENTATIVE PHYSICAL DUE IN 1 YEAR. No follow-ups on file.

## 2019-05-25 LAB — CBC WITH DIFFERENTIAL/PLATELET
Basophils Absolute: 0 10*3/uL (ref 0.0–0.2)
Basos: 1 %
EOS (ABSOLUTE): 0.2 10*3/uL (ref 0.0–0.4)
Eos: 7 %
Hematocrit: 44.7 % (ref 37.5–51.0)
Hemoglobin: 14.7 g/dL (ref 13.0–17.7)
Immature Grans (Abs): 0 10*3/uL (ref 0.0–0.1)
Immature Granulocytes: 0 %
Lymphocytes Absolute: 1.5 10*3/uL (ref 0.7–3.1)
Lymphs: 52 %
MCH: 31.1 pg (ref 26.6–33.0)
MCHC: 32.9 g/dL (ref 31.5–35.7)
MCV: 95 fL (ref 79–97)
Monocytes Absolute: 0.3 10*3/uL (ref 0.1–0.9)
Monocytes: 9 %
Neutrophils Absolute: 0.9 10*3/uL — ABNORMAL LOW (ref 1.4–7.0)
Neutrophils: 31 %
Platelets: 262 10*3/uL (ref 150–450)
RBC: 4.73 x10E6/uL (ref 4.14–5.80)
RDW: 11 % — ABNORMAL LOW (ref 11.6–15.4)
WBC: 3 10*3/uL — ABNORMAL LOW (ref 3.4–10.8)

## 2019-05-25 LAB — COMPREHENSIVE METABOLIC PANEL
ALT: 16 IU/L (ref 0–44)
AST: 21 IU/L (ref 0–40)
Albumin/Globulin Ratio: 1.7 (ref 1.2–2.2)
Albumin: 5 g/dL (ref 4.0–5.0)
Alkaline Phosphatase: 53 IU/L (ref 39–117)
BUN/Creatinine Ratio: 11 (ref 9–20)
BUN: 11 mg/dL (ref 6–20)
Bilirubin Total: 2.2 mg/dL — ABNORMAL HIGH (ref 0.0–1.2)
CO2: 26 mmol/L (ref 20–29)
Calcium: 10.2 mg/dL (ref 8.7–10.2)
Chloride: 103 mmol/L (ref 96–106)
Creatinine, Ser: 1.04 mg/dL (ref 0.76–1.27)
GFR calc Af Amer: 108 mL/min/{1.73_m2} (ref >59)
GFR calc non Af Amer: 93 mL/min/{1.73_m2} (ref >59)
Globulin, Total: 2.9 g/dL (ref 1.5–4.5)
Glucose: 66 mg/dL (ref 65–99)
Potassium: 4.2 mmol/L (ref 3.5–5.2)
Sodium: 142 mmol/L (ref 134–144)
Total Protein: 7.9 g/dL (ref 6.0–8.5)

## 2019-05-25 LAB — HSV(HERPES SIMPLEX VRS) I + II AB-IGG
HSV 1 Glycoprotein G Ab, IgG: 53.8 index — ABNORMAL HIGH (ref 0.00–0.90)
HSV 2 IgG, Type Spec: <0.91 index (ref 0.00–0.90)

## 2019-05-25 LAB — RPR: RPR Ser Ql: NONREACTIVE

## 2019-05-25 LAB — LIPID PANEL W/O CHOL/HDL RATIO
Cholesterol, Total: 168 mg/dL (ref 100–199)
HDL: 70 mg/dL (ref >39)
LDL Chol Calc (NIH): 84 mg/dL (ref 0–99)
Triglycerides: 71 mg/dL (ref 0–149)
VLDL Cholesterol Cal: 14 mg/dL (ref 5–40)

## 2019-05-25 LAB — HIV ANTIBODY (ROUTINE TESTING W REFLEX): HIV Screen 4th Generation wRfx: NONREACTIVE

## 2019-05-25 LAB — HEPATITIS PANEL, ACUTE
Hep A IgM: NEGATIVE
Hep B C IgM: NEGATIVE
Hep C Virus Ab: <0.1 s/co ratio (ref 0.0–0.9)
Hepatitis B Surface Ag: NEGATIVE

## 2019-05-25 LAB — TSH: TSH: 1.59 u[IU]/mL (ref 0.450–4.500)

## 2019-05-26 ENCOUNTER — Telehealth: Payer: Self-pay | Admitting: Family Medicine

## 2019-05-26 LAB — GC/CHLAMYDIA PROBE AMP
Chlamydia trachomatis, NAA: NEGATIVE
Neisseria Gonorrhoeae by PCR: NEGATIVE

## 2019-05-26 NOTE — Telephone Encounter (Signed)
Copied from CRM [id number]. Topic: General - Other >> May 25, 2019 12:48 PM Wyonia Hough E wrote: Reason for CRM: Pt called to go over lab results/ please advise >> May 26, 2019  4:21 PM Tamela Oddi wrote: Patient is returning a call to the office.  Please call patient back to discuss at [phone] >> May 25, 2019  1:33 PM Genia Harold, CMA wrote: See result note.

## 2019-05-26 NOTE — Telephone Encounter (Signed)
See result note, patient was notified of results by Myrtha Mantis, CMA.

## 2020-05-25 ENCOUNTER — Encounter: Payer: Medicaid Other | Admitting: Family Medicine

## 2020-06-22 ENCOUNTER — Ambulatory Visit (INDEPENDENT_AMBULATORY_CARE_PROVIDER_SITE_OTHER): Payer: Medicaid Other | Admitting: Family Medicine

## 2020-06-22 ENCOUNTER — Telehealth: Payer: Self-pay

## 2020-06-22 ENCOUNTER — Other Ambulatory Visit: Payer: Self-pay

## 2020-06-22 ENCOUNTER — Encounter: Payer: Self-pay | Admitting: Family Medicine

## 2020-06-22 VITALS — BP 114/71 | HR 53 | Temp 97.9°F | Ht 70.0 in | Wt 143.0 lb

## 2020-06-22 DIAGNOSIS — Z23 Encounter for immunization: Secondary | ICD-10-CM | POA: Diagnosis not present

## 2020-06-22 DIAGNOSIS — Z113 Encounter for screening for infections with a predominantly sexual mode of transmission: Secondary | ICD-10-CM

## 2020-06-22 DIAGNOSIS — Z0000 Encounter for general adult medical examination without abnormal findings: Secondary | ICD-10-CM | POA: Diagnosis not present

## 2020-06-22 DIAGNOSIS — S069X0S Unspecified intracranial injury without loss of consciousness, sequela: Secondary | ICD-10-CM | POA: Diagnosis not present

## 2020-06-22 DIAGNOSIS — W540XXA Bitten by dog, initial encounter: Secondary | ICD-10-CM

## 2020-06-22 DIAGNOSIS — Z0100 Encounter for examination of eyes and vision without abnormal findings: Secondary | ICD-10-CM

## 2020-06-22 LAB — URINALYSIS, ROUTINE W REFLEX MICROSCOPIC
Bilirubin, UA: NEGATIVE
Glucose, UA: NEGATIVE
Ketones, UA: NEGATIVE
Leukocytes,UA: NEGATIVE
Nitrite, UA: NEGATIVE
RBC, UA: NEGATIVE
Specific Gravity, UA: 1.02 (ref 1.005–1.030)
Urobilinogen, Ur: 1 mg/dL (ref 0.2–1.0)
pH, UA: 5.5 (ref 5.0–7.5)

## 2020-06-22 MED ORDER — AMOXICILLIN-POT CLAVULANATE 875-125 MG PO TABS
1.0000 | ORAL_TABLET | Freq: Two times a day (BID) | ORAL | 0 refills | Status: DC
Start: 2020-06-22 — End: 2020-11-10

## 2020-06-22 NOTE — Assessment & Plan Note (Addendum)
Step mom is concerned as he seems to be having more issues. Has been having issues with concentration and has been snappy with her. He also seems to be losing some functionality that he had previously. List of counselors given to help with his frustration. Referral to neurology placed today to make sure there have been no functional/anatomic changes. Await their input

## 2020-06-22 NOTE — Telephone Encounter (Signed)
lvm to ask pt if his step mother number is on file if they call back please ask him what her number is and make that primary number

## 2020-06-22 NOTE — Progress Notes (Addendum)
BP 114/71   Pulse (!) 53   Temp 97.9 F (36.6 C)   Ht 5\' 10"  (1.778 m)   Wt 143 lb (64.9 kg)   SpO2 98%   BMI 20.52 kg/m    Subjective:    Patient ID: Danny Valdez, male    DOB: 01-Jun-1985, 35 y.o.   MRN: [id number]  HPI: Danny Valdez is a 35 y.o. male presenting on 06/22/2020 for comprehensive medical examination. Current medical complaints include:  Got bit by his dog yesterday. He is up to date on his shots. His finger has been a little swollen and painful.   He currently lives with: father and step mom Interim Problems from his last visit: no  Depression Screen done today and results listed below:  Depression screen [facility] 2/9 06/22/2020 06/22/2020 05/24/2019 08/07/2018 12/25/2015  Decreased Interest 0 0 0 0 0  Down, Depressed, Hopeless 1 1 0 0 0  PHQ - 2 Score 1 1 0 0 0  Altered sleeping 0 - 0 0 1  Tired, decreased energy 0 - 0 0 0  Change in appetite 0 - 0 0 0  Feeling bad or failure about yourself  1 - 1 0 0  Trouble concentrating 1 - 0 0 0  Moving slowly or fidgety/restless 1 - 0 0 0  Suicidal thoughts 0 - 0 0 0  PHQ-9 Score 4 - 1 0 1  Difficult doing work/chores Not difficult at all - Not difficult at all - -    Past Medical History:  Past Medical History:  Diagnosis Date  . ADHD (attention deficit hyperactivity disorder)   . Allergy   . Anxiety   . Brain damage   . Depression   . Seizures (HCC)     Surgical History:  No past surgical history on file.  Medications:  No current outpatient medications on file prior to visit.   No current facility-administered medications on file prior to visit.    Allergies:  No Known Allergies  Social History:  Social History   Socioeconomic History  . Marital status: Single    Spouse name: Not on file  . Number of children: Not on file  . Years of education: Not on file  . Highest education level: Not on file  Occupational History  . Not on file  Tobacco Use  . Smoking status: Former Smoker    Quit date:  04/05/2014    Years since quitting: 6.2  . Smokeless tobacco: Never Used  Vaping Use  . Vaping Use: Former  Substance and Sexual Activity  . Alcohol use: Yes    Alcohol/week: 4.0 standard drinks    Types: 4 Cans of beer per week    Comment: Occasionally/Rarely  . Drug use: Yes    Types: Marijuana    Comment: occ  . Sexual activity: Never  Other Topics Concern  . Not on file  Social History Narrative  . Not on file   Social Determinants of Health   Financial Resource Strain: Not on file  Food Insecurity: Not on file  Transportation Needs: Not on file  Physical Activity: Not on file  Stress: Not on file  Social Connections: Not on file  Intimate Partner Violence: Not on file   Social History   Tobacco Use  Smoking Status Former Smoker  . Quit date: 04/05/2014  . Years since quitting: 6.2  Smokeless Tobacco Never Used   Social History   Substance and Sexual Activity  Alcohol Use Yes  .  Alcohol/week: 4.0 standard drinks  . Types: 4 Cans of beer per week   Comment: Occasionally/Rarely    Family History:  Family History  Problem Relation Age of Onset  . Hypertension Father   . Diabetes Maternal Grandmother   . Hypertension Maternal Grandmother   . Diabetes Paternal Grandmother   . Gout Paternal Grandfather   . Diabetes Paternal Grandfather   . Hypertension Paternal Grandfather   . Aneurysm Paternal Grandfather     Past medical history, surgical history, medications, allergies, family history and social history reviewed with patient today and changes made to appropriate areas of the chart.   Review of Systems  Constitutional: Negative.   HENT: Negative.   Eyes: Negative.   Respiratory: Negative.   Cardiovascular: Negative.   Gastrointestinal: Negative.   Genitourinary: Negative.   Musculoskeletal: Positive for back pain and myalgias. Negative for falls, joint pain and neck pain.  Skin: Negative.   Neurological: Negative.   Endo/Heme/Allergies: Negative.    Psychiatric/Behavioral: Negative.    All other ROS negative except what is listed above and in the HPI.      Objective:    BP 114/71   Pulse (!) 53   Temp 97.9 F (36.6 C)   Ht 5\' 10"  (1.778 m)   Wt 143 lb (64.9 kg)   SpO2 98%   BMI 20.52 kg/m   Wt Readings from Last 3 Encounters:  06/22/20 143 lb (64.9 kg)  05/24/19 141 lb 3.2 oz (64 kg)  10/02/18 141 lb (64 kg)    Physical Exam Vitals and nursing note reviewed.  Constitutional:      General: He is not in acute distress.    Appearance: Normal appearance. He is normal weight. He is not ill-appearing, toxic-appearing or diaphoretic.  HENT:     Head: Normocephalic and atraumatic.     Right Ear: Tympanic membrane, ear canal and external ear normal. There is no impacted cerumen.     Left Ear: Tympanic membrane, ear canal and external ear normal. There is no impacted cerumen.     Nose: Nose normal. No congestion or rhinorrhea.     Mouth/Throat:     Mouth: Mucous membranes are moist.     Pharynx: Oropharynx is clear. No oropharyngeal exudate or posterior oropharyngeal erythema.  Eyes:     General: No scleral icterus.       Right eye: No discharge.        Left eye: No discharge.     Extraocular Movements: Extraocular movements intact.     Conjunctiva/sclera: Conjunctivae normal.     Pupils: Pupils are equal, round, and reactive to light.  Neck:     Vascular: No carotid bruit.  Cardiovascular:     Rate and Rhythm: Normal rate and regular rhythm.     Pulses: Normal pulses.     Heart sounds: No murmur heard. No friction rub. No gallop.   Pulmonary:     Effort: Pulmonary effort is normal. No respiratory distress.     Breath sounds: Normal breath sounds. No stridor. No wheezing, rhonchi or rales.  Chest:     Chest wall: No tenderness.  Abdominal:     General: Abdomen is flat. Bowel sounds are normal. There is no distension.     Palpations: Abdomen is soft. There is no mass.     Tenderness: There is no abdominal  tenderness. There is no right CVA tenderness, left CVA tenderness, guarding or rebound.     Hernia: No hernia is present.  Genitourinary:  Comments: Genital exam deferred with shared decision making Musculoskeletal:        General: No swelling, tenderness, deformity or signs of injury.     Cervical back: Normal range of motion and neck supple. No rigidity. No muscular tenderness.     Right lower leg: No edema.     Left lower leg: No edema.  Lymphadenopathy:     Cervical: No cervical adenopathy.  Skin:    General: Skin is warm and dry.     Capillary Refill: Capillary refill takes less than 2 seconds.     Coloration: Skin is not jaundiced or pale.     Findings: No bruising, erythema, lesion or rash.     Comments: 1cm wound on L index finger, well aproximated, mild swelling  Neurological:     General: No focal deficit present.     Mental Status: He is alert and oriented to person, place, and time.     Cranial Nerves: No cranial nerve deficit.     Sensory: No sensory deficit.     Motor: No weakness.     Coordination: Coordination normal.     Gait: Gait normal.     Deep Tendon Reflexes: Reflexes normal.  Psychiatric:        Mood and Affect: Mood normal.        Behavior: Behavior normal.        Thought Content: Thought content normal.        Judgment: Judgment normal.     Results for orders placed or performed in visit on 06/22/20  Urinalysis, Routine w reflex microscopic  Result Value Ref Range   Specific Gravity, UA 1.020 1.005 - 1.030   pH, UA 5.5 5.0 - 7.5   Color, UA Yellow Yellow   Appearance Ur Clear Clear   Leukocytes,UA Negative Negative   Protein,UA Trace (A) Negative/Trace   Glucose, UA Negative Negative   Ketones, UA Negative Negative   RBC, UA Negative Negative   Bilirubin, UA Negative Negative   Urobilinogen, Ur 1.0 0.2 - 1.0 mg/dL   Nitrite, UA Negative Negative      Assessment & Plan:   Problem List Items Addressed This Visit      Nervous and  Auditory   TBI (traumatic brain injury) (HCC)    Step mom is concerned as he seems to be having more issues. Has been having issues with concentration and has been snappy with her. He also seems to be losing some functionality that he had previously. List of counselors given to help with his frustration. Referral to neurology placed today to make sure there have been no functional/anatomic changes. Await their input       Relevant Orders   Ambulatory referral to Neurology    Other Visit Diagnoses    Routine general medical examination at a health care facility    -  Primary   Vaccines updated. Screening labs checked today. Continue diet and exercise. Call with any concerns.    Relevant Orders   Comprehensive metabolic panel   CBC with Differential/Platelet   Lipid Panel w/o Chol/HDL Ratio   TSH   Urinalysis, Routine w reflex microscopic (Completed)   Routine screening for STI (sexually transmitted infection)       Labs drawn today. Await results.    Relevant Orders   HIV Antibody (routine testing w rflx)   RPR   GC/Chlamydia Probe Amp   HSV(herpes simplex vrs) 1+2 ab-IgG   Acute Viral Hepatitis (HAV, HBV, HCV)   Dog  bite, initial encounter       Will treat empirically with augmentin. Call if not getting better or getting worse. Td updated today.   Relevant Orders   Td : Tetanus/diphtheria >7yo Preservative  free (Completed)   Eye exam, routine       Referral to opthalmology made today.   Relevant Orders   Ambulatory referral to Ophthalmology       LABORATORY TESTING:  Health maintenance labs ordered today as discussed above.   IMMUNIZATIONS:   - Tdap: Tetanus vaccination status reviewed: Td vaccination indicated and given today. - Influenza: Postponed to flu season - Pneumovax: Not applicable - COVID: Refused  PATIENT COUNSELING:    Sexuality: Discussed sexually transmitted diseases, partner selection, use of condoms, avoidance of unintended pregnancy  and contraceptive  alternatives.   Advised to avoid cigarette smoking.  I discussed with the patient that most people either abstain from alcohol or drink within safe limits (<=14/week and <=4 drinks/occasion for males, <=7/weeks and <= 3 drinks/occasion for females) and that the risk for alcohol disorders and other health effects rises proportionally with the number of drinks per week and how often a drinker exceeds daily limits.  Discussed cessation/primary prevention of drug use and availability of treatment for abuse.   Diet: Encouraged to adjust caloric intake to maintain  or achieve ideal body weight, to reduce intake of dietary saturated fat and total fat, to limit sodium intake by avoiding high sodium foods and not adding table salt, and to maintain adequate dietary potassium and calcium preferably from fresh fruits, vegetables, and low-fat dairy products.    stressed the importance of regular exercise  Injury prevention: Discussed safety belts, safety helmets, smoke detector, smoking near bedding or upholstery.   Dental health: Discussed importance of regular tooth brushing, flossing, and dental visits.   Follow up plan: NEXT PREVENTATIVE PHYSICAL DUE IN 1 YEAR. Return in about 3 months (around 09/22/2020) for follow up concentration.

## 2020-06-22 NOTE — Patient Instructions (Addendum)
Health Maintenance, Male Adopting a healthy lifestyle and getting preventive care are important in promoting health and wellness. Ask your health care provider about:  The right schedule for you to have regular tests and exams.  Things you can do on your own to prevent diseases and keep yourself healthy. What should I know about diet, weight, and exercise? Eat a healthy diet  Eat a diet that includes plenty of vegetables, fruits, low-fat dairy products, and lean protein.  Do not eat a lot of foods that are high in solid fats, added sugars, or sodium.   Maintain a healthy weight Body mass index (BMI) is a measurement that can be used to identify possible weight problems. It estimates body fat based on height and weight. Your health care provider can help determine your BMI and help you achieve or maintain a healthy weight. Get regular exercise Get regular exercise. This is one of the most important things you can do for your health. Most adults should:  Exercise for at least 150 minutes each week. The exercise should increase your heart rate and make you sweat (moderate-intensity exercise).  Do strengthening exercises at least twice a week. This is in addition to the moderate-intensity exercise.  Spend less time sitting. Even light physical activity can be beneficial. Watch cholesterol and blood lipids Have your blood tested for lipids and cholesterol at 36 years of age, then have this test every 5 years. You may need to have your cholesterol levels checked more often if:  Your lipid or cholesterol levels are high.  You are older than 36 years of age.  You are at high risk for heart disease. What should I know about cancer screening? Many types of cancers can be detected early and may often be prevented. Depending on your health history and family history, you may need to have cancer screening at various ages. This may include screening for:  Colorectal cancer.  Prostate  cancer.  Skin cancer.  Lung cancer. What should I know about heart disease, diabetes, and high blood pressure? Blood pressure and heart disease  High blood pressure causes heart disease and increases the risk of stroke. This is more likely to develop in people who have high blood pressure readings, are of African descent, or are overweight.  Talk with your health care provider about your target blood pressure readings.  Have your blood pressure checked: ? Every 3-5 years if you are 18-39 years of age. ? Every year if you are 40 years old or older.  If you are between the ages of 65 and 75 and are a current or former smoker, ask your health care provider if you should have a one-time screening for abdominal aortic aneurysm (AAA). Diabetes Have regular diabetes screenings. This checks your fasting blood sugar level. Have the screening done:  Once every three years after age 45 if you are at a normal weight and have a low risk for diabetes.  More often and at a younger age if you are overweight or have a high risk for diabetes. What should I know about preventing infection? Hepatitis B If you have a higher risk for hepatitis B, you should be screened for this virus. Talk with your health care provider to find out if you are at risk for hepatitis B infection. Hepatitis C Blood testing is recommended for:  Everyone born from 1945 through 1965.  Anyone with known risk factors for hepatitis C. Sexually transmitted infections (STIs)  You should be screened each   year for STIs, including gonorrhea and chlamydia, if: ? You are sexually active and are younger than 36 years of age. ? You are older than 36 years of age and your health care provider tells you that you are at risk for this type of infection. ? Your sexual activity has changed since you were last screened, and you are at increased risk for chlamydia or gonorrhea. Ask your health care provider if you are at risk.  Ask your  health care provider about whether you are at high risk for HIV. Your health care provider may recommend a prescription medicine to help prevent HIV infection. If you choose to take medicine to prevent HIV, you should first get tested for HIV. You should then be tested every 3 months for as long as you are taking the medicine. Follow these instructions at home: Lifestyle  Do not use any products that contain nicotine or tobacco, such as cigarettes, e-cigarettes, and chewing tobacco. If you need help quitting, ask your health care provider.  Do not use street drugs.  Do not share needles.  Ask your health care provider for help if you need support or information about quitting drugs. Alcohol use  Do not drink alcohol if your health care provider tells you not to drink.  If you drink alcohol: ? Limit how much you have to 0-2 drinks a day. ? Be aware of how much alcohol is in your drink. In the [place] one drink equals one 12 oz bottle of beer (355 mL), one 5 oz glass of wine (148 mL), or one 1 oz glass of hard liquor (44 mL). General instructions  Schedule regular health, dental, and eye exams.  Stay current with your vaccines.  Tell your health care provider if: ? You often feel depressed. ? You have ever been abused or do not feel safe at home. Summary  Adopting a healthy lifestyle and getting preventive care are important in promoting health and wellness.  Follow your health care provider's instructions about healthy diet, exercising, and getting tested or screened for diseases.  Follow your health care provider's instructions on monitoring your cholesterol and blood pressure. This information is not intended to replace advice given to you by your health care provider. Make sure you discuss any questions you have with your health care provider. Document Revised: 12/31/2017 Document Reviewed: 12/31/2017 Elsevier Patient Education  2021 [employer]  Back Exercises The  following exercises strengthen the muscles that help to support the trunk and back. They also help to keep the lower back flexible. Doing these exercises can help to prevent back pain or lessen existing pain.  If you have back pain or discomfort, try doing these exercises 2-3 times each day or as told by your health care provider.  As your pain improves, do them once each day, but increase the number of times that you repeat the steps for each exercise (do more repetitions).  To prevent the recurrence of back pain, continue to do these exercises once each day or as told by your health care provider. Do exercises exactly as told by your health care provider and adjust them as directed. It is normal to feel mild stretching, pulling, tightness, or discomfort as you do these exercises, but you should stop right away if you feel sudden pain or your pain gets worse. Exercises Single knee to chest Repeat these steps 3-5 times for each leg: 1. Lie on your back on a firm bed or the floor with your  legs extended. 2. Bring one knee to your chest. Your other leg should stay extended and in contact with the floor. 3. Hold your knee in place by grabbing your knee or thigh with both hands and hold. 4. Pull on your knee until you feel a gentle stretch in your lower back or buttocks. 5. Hold the stretch for 10-30 seconds. 6. Slowly release and straighten your leg. Pelvic tilt Repeat these steps 5-10 times: 1. Lie on your back on a firm bed or the floor with your legs extended. 2. Bend your knees so they are pointing toward the ceiling and your feet are flat on the floor. 3. Tighten your lower abdominal muscles to press your lower back against the floor. This motion will tilt your pelvis so your tailbone points up toward the ceiling instead of pointing to your feet or the floor. 4. With gentle tension and even breathing, hold this position for 5-10 seconds. Cat-cow Repeat these steps until your lower back  becomes more flexible: 1. Get into a hands-and-knees position on a firm surface. Keep your hands under your shoulders, and keep your knees under your hips. You may place padding under your knees for comfort. 2. Let your head hang down toward your chest. Contract your abdominal muscles and point your tailbone toward the floor so your lower back becomes rounded like the back of a cat. 3. Hold this position for 5 seconds. 4. Slowly lift your head, let your abdominal muscles relax and point your tailbone up toward the ceiling so your back forms a sagging arch like the back of a cow. 5. Hold this position for 5 seconds.   Press-ups Repeat these steps 5-10 times: 1. Lie on your abdomen (face-down) on the floor. 2. Place your palms near your head, about shoulder-width apart. 3. Keeping your back as relaxed as possible and keeping your hips on the floor, slowly straighten your arms to raise the top half of your body and lift your shoulders. Do not use your back muscles to raise your upper torso. You may adjust the placement of your hands to make yourself more comfortable. 4. Hold this position for 5 seconds while you keep your back relaxed. 5. Slowly return to lying flat on the floor.   Bridges Repeat these steps 10 times: 1. Lie on your back on a firm surface. 2. Bend your knees so they are pointing toward the ceiling and your feet are flat on the floor. Your arms should be flat at your sides, next to your body. 3. Tighten your buttocks muscles and lift your buttocks off the floor until your waist is at almost the same height as your knees. You should feel the muscles working in your buttocks and the back of your thighs. If you do not feel these muscles, slide your feet 1-2 inches farther away from your buttocks. 4. Hold this position for 3-5 seconds. 5. Slowly lower your hips to the starting position, and allow your buttocks muscles to relax completely. If this exercise is too easy, try doing it with  your arms crossed over your chest.   Abdominal crunches Repeat these steps 5-10 times: 1. Lie on your back on a firm bed or the floor with your legs extended. 2. Bend your knees so they are pointing toward the ceiling and your feet are flat on the floor. 3. Cross your arms over your chest. 4. Tip your chin slightly toward your chest without bending your neck. 5. Tighten your abdominal muscles and  slowly raise your trunk (torso) high enough to lift your shoulder blades a tiny bit off the floor. Avoid raising your torso higher than that because it can put too much stress on your low back and does not help to strengthen your abdominal muscles. 6. Slowly return to your starting position. Back lifts Repeat these steps 5-10 times: 1. Lie on your abdomen (face-down) with your arms at your sides, and rest your forehead on the floor. 2. Tighten the muscles in your legs and your buttocks. 3. Slowly lift your chest off the floor while you keep your hips pressed to the floor. Keep the back of your head in line with the curve in your back. Your eyes should be looking at the floor. 4. Hold this position for 3-5 seconds. 5. Slowly return to your starting position. Contact a health care provider if:  Your back pain or discomfort gets much worse when you do an exercise.  Your worsening back pain or discomfort does not lessen within 2 hours after you exercise. If you have any of these problems, stop doing these exercises right away. Do not do them again unless your health care provider says that you can. Get help right away if:  You develop sudden, severe back pain. If this happens, stop doing the exercises right away. Do not do them again unless your health care provider says that you can. This information is not intended to replace advice given to you by your health care provider. Make sure you discuss any questions you have with your health care provider. Document Revised: 05/14/2018 Document Reviewed:  10/09/2017 Elsevier Patient Education  2021 [employer].

## 2020-06-22 NOTE — Addendum Note (Signed)
Addended by: Dorcas Carrow on: 06/22/2020 03:52 PM   Modules accepted: Orders

## 2020-06-23 LAB — ACUTE VIRAL HEPATITIS (HAV, HBV, HCV)
HCV Ab: 0.1 s/co ratio (ref 0.0–0.9)
Hep A IgM: NEGATIVE
Hep B C IgM: NEGATIVE
Hepatitis B Surface Ag: NEGATIVE

## 2020-06-23 LAB — HCV INTERPRETATION

## 2020-06-25 LAB — GC/CHLAMYDIA PROBE AMP
Chlamydia trachomatis, NAA: NEGATIVE
Neisseria Gonorrhoeae by PCR: NEGATIVE

## 2020-06-27 LAB — CBC WITH DIFFERENTIAL/PLATELET
Basophils Absolute: 0 10*3/uL (ref 0.0–0.2)
Basos: 1 %
EOS (ABSOLUTE): 0.1 10*3/uL (ref 0.0–0.4)
Eos: 2 %
Hematocrit: 44 % (ref 37.5–51.0)
Hemoglobin: 14.5 g/dL (ref 13.0–17.7)
Immature Grans (Abs): 0 10*3/uL (ref 0.0–0.1)
Immature Granulocytes: 0 %
Lymphocytes Absolute: 1.7 10*3/uL (ref 0.7–3.1)
Lymphs: 34 %
MCH: 31.2 pg (ref 26.6–33.0)
MCHC: 33 g/dL (ref 31.5–35.7)
MCV: 95 fL (ref 79–97)
Monocytes Absolute: 0.6 10*3/uL (ref 0.1–0.9)
Monocytes: 12 %
Neutrophils Absolute: 2.6 10*3/uL (ref 1.4–7.0)
Neutrophils: 51 %
Platelets: 256 10*3/uL (ref 150–450)
RBC: 4.65 x10E6/uL (ref 4.14–5.80)
RDW: 11.3 % — ABNORMAL LOW (ref 11.6–15.4)
WBC: 5.1 10*3/uL (ref 3.4–10.8)

## 2020-06-27 LAB — RPR, QUANT+TP ABS (REFLEX)
Rapid Plasma Reagin, Quant: 1:1 {titer} — ABNORMAL HIGH
T Pallidum Abs: NONREACTIVE

## 2020-06-27 LAB — COMPREHENSIVE METABOLIC PANEL
ALT: 16 IU/L (ref 0–44)
AST: 39 IU/L (ref 0–40)
Albumin/Globulin Ratio: 1.9 (ref 1.2–2.2)
Albumin: 5 g/dL (ref 4.0–5.0)
Alkaline Phosphatase: 51 IU/L (ref 44–121)
BUN/Creatinine Ratio: 10 (ref 9–20)
BUN: 14 mg/dL (ref 6–20)
Bilirubin Total: 2.8 mg/dL — ABNORMAL HIGH (ref 0.0–1.2)
CO2: 25 mmol/L (ref 20–29)
Calcium: 9.5 mg/dL (ref 8.7–10.2)
Chloride: 99 mmol/L (ref 96–106)
Creatinine, Ser: 1.4 mg/dL — ABNORMAL HIGH (ref 0.76–1.27)
Globulin, Total: 2.7 g/dL (ref 1.5–4.5)
Glucose: 95 mg/dL (ref 65–99)
Potassium: 4 mmol/L (ref 3.5–5.2)
Sodium: 138 mmol/L (ref 134–144)
Total Protein: 7.7 g/dL (ref 6.0–8.5)
eGFR: 67 mL/min/{1.73_m2} (ref >59)

## 2020-06-27 LAB — LIPID PANEL W/O CHOL/HDL RATIO
Cholesterol, Total: 184 mg/dL (ref 100–199)
HDL: 78 mg/dL (ref >39)
LDL Chol Calc (NIH): 95 mg/dL (ref 0–99)
Triglycerides: 56 mg/dL (ref 0–149)
VLDL Cholesterol Cal: 11 mg/dL (ref 5–40)

## 2020-06-27 LAB — TSH: TSH: 0.755 u[IU]/mL (ref 0.450–4.500)

## 2020-06-27 LAB — RPR: RPR Ser Ql: REACTIVE — AB

## 2020-06-27 LAB — HIV ANTIBODY (ROUTINE TESTING W REFLEX): HIV Screen 4th Generation wRfx: NONREACTIVE

## 2020-06-27 LAB — HSV(HERPES SIMPLEX VRS) I + II AB-IGG
HSV 1 Glycoprotein G Ab, IgG: 46.2 index — ABNORMAL HIGH (ref 0.00–0.90)
HSV 2 IgG, Type Spec: <0.91 index (ref 0.00–0.90)

## 2020-07-04 ENCOUNTER — Ambulatory Visit: Payer: Medicaid Other | Admitting: Physician Assistant

## 2020-07-04 ENCOUNTER — Other Ambulatory Visit: Payer: Self-pay

## 2020-07-04 DIAGNOSIS — R768 Other specified abnormal immunological findings in serum: Secondary | ICD-10-CM

## 2020-07-04 DIAGNOSIS — Z113 Encounter for screening for infections with a predominantly sexual mode of transmission: Secondary | ICD-10-CM

## 2020-07-05 ENCOUNTER — Encounter: Payer: Self-pay | Admitting: Physician Assistant

## 2020-07-05 DIAGNOSIS — R768 Other specified abnormal immunological findings in serum: Secondary | ICD-10-CM | POA: Insufficient documentation

## 2020-07-05 NOTE — Progress Notes (Signed)
[facility] Department STI clinic/screening visit  Subjective:  Danny Valdez is a 35 y.o. male being seen today for an STI screening visit. The patient reports they do not have symptoms.    Patient has the following medical conditions:   Patient Active Problem List   Diagnosis Date Noted   Depression    Leukopenia 10/18/2014   TBI (traumatic brain injury) (HCC) 09/14/2014   Foot pain, right 09/14/2014   Lump of skin of right lower extremity 09/14/2014     Chief Complaint  Patient presents with   SEXUALLY TRANSMITTED DISEASE    screening    HPI  Patient reports that he was told to come here for treatment of Syphilis.  Reports that he was seen by his PCP for an annual physical around 2 weeks ago and was then called and told that his Syphilis test was positive and he needs treatment.  Patient denies any symptoms, known contacts and has not been notified or contacted by DIS that he needs treatment.  Reports that his last HIV test was at his physical, he has no previous history of any sexually transmitted infections, uses condoms always with sexual activity and his last sexual contact was about 4 months ago.  Reports that he is a former smoker of cigarettes and user of EtOH but no longer either smokes or drinks.  States that he does not know what his titer numbers for his Syphilis testing were.  Denies current regular medicines except an antibiotic that he is taking due to a dog bite.  Reports that he has not had any surgeries.   See flowsheet for further details and programmatic requirements.    The following portions of the patient's history were reviewed and updated as appropriate: allergies, current medications, past medical history, past social history, past surgical history and problem list.  Objective:  There were no vitals filed for this visit.  Physical Exam Constitutional:      General: He is not in acute distress.    Appearance: Normal appearance.   HENT:     Head: Normocephalic and atraumatic.  Eyes:     Conjunctiva/sclera: Conjunctivae normal.  Pulmonary:     Effort: Pulmonary effort is normal.  Skin:    General: Skin is warm and dry.  Neurological:     Mental Status: He is alert and oriented to person, place, and time.  Psychiatric:        Mood and Affect: Mood normal.        Behavior: Behavior normal.        Thought Content: Thought content normal.        Judgment: Judgment normal.      Assessment and Plan:  Danny Valdez is a 35 y.o. male presenting to the [facility] Department for STI screening  1. Screening for STD (sexually transmitted disease) Patient into clinic without symptoms. Reviewed patient's chart and per PCP office results, patient had a reactive RPR with 1:1 titer, T. Palladium confirmatory test was non-reactive. Reviewed with patient and his sister, who patient requested to be present in the room, that the patient does not have Syphilis. Extensive discussion with patient and his sister about Syphilis- the disease, transmission, symptoms, sequelae if not treated and also about false positive screening test results.  Counseled patient that for him to have Syphilis, both the screening test and confirmatory test would be positive. Counseled patient that since he has had this false positive test, if he is tested  in the future this may happen again and that he should make sure that the confirmatory test is done to confirm diagnosis before treatment. Enc to RTC or call if he has further questions. Rec condoms with all sex.      No follow-ups on file.  Future Appointments  Date Time Provider Department Center  09/22/2020  4:00 PM Dorcas Carrow, DO [facility]    [facility], [place]

## 2020-07-27 DIAGNOSIS — F438 Other reactions to severe stress: Secondary | ICD-10-CM | POA: Diagnosis not present

## 2020-08-03 DIAGNOSIS — F438 Other reactions to severe stress: Secondary | ICD-10-CM | POA: Diagnosis not present

## 2020-08-10 DIAGNOSIS — F438 Other reactions to severe stress: Secondary | ICD-10-CM | POA: Diagnosis not present

## 2020-08-24 DIAGNOSIS — F331 Major depressive disorder, recurrent, moderate: Secondary | ICD-10-CM | POA: Diagnosis not present

## 2020-08-31 DIAGNOSIS — F331 Major depressive disorder, recurrent, moderate: Secondary | ICD-10-CM | POA: Diagnosis not present

## 2020-09-22 ENCOUNTER — Ambulatory Visit: Payer: Medicaid Other | Admitting: Family Medicine

## 2020-11-10 ENCOUNTER — Encounter: Payer: Self-pay | Admitting: Family Medicine

## 2020-11-10 ENCOUNTER — Ambulatory Visit: Payer: Medicaid Other | Admitting: Family Medicine

## 2020-11-10 ENCOUNTER — Other Ambulatory Visit: Payer: Self-pay

## 2020-11-10 VITALS — BP 123/70 | HR 60 | Ht 71.0 in | Wt 144.0 lb

## 2020-11-10 DIAGNOSIS — S069X0S Unspecified intracranial injury without loss of consciousness, sequela: Secondary | ICD-10-CM | POA: Diagnosis not present

## 2020-11-10 DIAGNOSIS — H6121 Impacted cerumen, right ear: Secondary | ICD-10-CM

## 2020-11-10 DIAGNOSIS — Z23 Encounter for immunization: Secondary | ICD-10-CM

## 2020-11-10 NOTE — Progress Notes (Signed)
BP 123/70   Pulse 60   Ht 5' 11"  (1.803 m)   Wt 144 lb (65.3 kg)   BMI 20.08 kg/m    Subjective:    Patient ID: Danny Valdez, male    DOB: 05-03-1985, 35 y.o.   MRN: [id number]  HPI: Danny Valdez is a 35 y.o. male  Chief Complaint  Patient presents with   Concentration problem   Has been talking to a counselor. He notes that he's not getting angry anymore. He's got ways of coping. He's feeling better. No other concerns.   EAG CLOGGED Duration: weeks Involved ear(s):  "right Sensation of feeling clogged/plugged: yes Decreased/muffled hearing:yes Ear pain: yes Fever: no Otorrhea: no Hearing loss: yes Upper respiratory infection symptoms: no Using Q-Tips: yes Status: worse History of cerumenosis: no Treatments attempted: none   Relevant past medical, surgical, family and social history reviewed and updated as indicated. Interim medical history since our last visit reviewed. Allergies and medications reviewed and updated.  Review of Systems  Constitutional: Negative.   Respiratory: Negative.    Cardiovascular: Negative.   Gastrointestinal: Negative.   Musculoskeletal: Negative.   Neurological: Negative.   Psychiatric/Behavioral: Negative.     Per HPI unless specifically indicated above     Objective:    BP 123/70   Pulse 60   Ht 5' 11"  (1.803 m)   Wt 144 lb (65.3 kg)   BMI 20.08 kg/m   Wt Readings from Last 3 Encounters:  11/10/20 144 lb (65.3 kg)  06/22/20 143 lb (64.9 kg)  05/24/19 141 lb 3.2 oz (64 kg)    Physical Exam Vitals and nursing note reviewed.  Constitutional:      General: He is not in acute distress.    Appearance: Normal appearance. He is not ill-appearing, toxic-appearing or diaphoretic.  HENT:     Head: Normocephalic and atraumatic.     Right Ear: External ear normal. There is impacted cerumen.     Left Ear: Tympanic membrane, ear canal and external ear normal.     Nose: Nose normal.     Mouth/Throat:     Mouth: Mucous  membranes are moist.     Pharynx: Oropharynx is clear.  Eyes:     General: No scleral icterus.       Right eye: No discharge.        Left eye: No discharge.     Extraocular Movements: Extraocular movements intact.     Conjunctiva/sclera: Conjunctivae normal.     Pupils: Pupils are equal, round, and reactive to light.  Cardiovascular:     Rate and Rhythm: Normal rate and regular rhythm.     Pulses: Normal pulses.     Heart sounds: Normal heart sounds. No murmur heard.   No friction rub. No gallop.  Pulmonary:     Effort: Pulmonary effort is normal. No respiratory distress.     Breath sounds: Normal breath sounds. No stridor. No wheezing, rhonchi or rales.  Chest:     Chest wall: No tenderness.  Musculoskeletal:        General: Normal range of motion.     Cervical back: Normal range of motion and neck supple.  Skin:    General: Skin is warm and dry.     Capillary Refill: Capillary refill takes less than 2 seconds.     Coloration: Skin is not jaundiced or pale.     Findings: No bruising, erythema, lesion or rash.  Neurological:     General: No focal  deficit present.     Mental Status: He is alert and oriented to person, place, and time. Mental status is at baseline.  Psychiatric:        Mood and Affect: Mood normal.        Behavior: Behavior normal.        Thought Content: Thought content normal.        Judgment: Judgment normal.    Results for orders placed or performed in visit on 06/22/20  GC/Chlamydia Probe Amp   Specimen: Urine   UR  Result Value Ref Range   Chlamydia trachomatis, NAA Negative Negative   Neisseria Gonorrhoeae by PCR Negative Negative  Comprehensive metabolic panel  Result Value Ref Range   Glucose 95 65 - 99 mg/dL   BUN 14 6 - 20 mg/dL   Creatinine, Ser 1.40 (H) 0.76 - 1.27 mg/dL   eGFR 67 >59 mL/min/1.73   BUN/Creatinine Ratio 10 9 - 20   Sodium 138 134 - 144 mmol/L   Potassium 4.0 3.5 - 5.2 mmol/L   Chloride 99 96 - 106 mmol/L   CO2 25 20 -  29 mmol/L   Calcium 9.5 8.7 - 10.2 mg/dL   Total Protein 7.7 6.0 - 8.5 g/dL   Albumin 5.0 4.0 - 5.0 g/dL   Globulin, Total 2.7 1.5 - 4.5 g/dL   Albumin/Globulin Ratio 1.9 1.2 - 2.2   Bilirubin Total 2.8 (H) 0.0 - 1.2 mg/dL   Alkaline Phosphatase 51 44 - 121 IU/L   AST 39 0 - 40 IU/L   ALT 16 0 - 44 IU/L  CBC with Differential/Platelet  Result Value Ref Range   WBC 5.1 3.4 - 10.8 x10E3/uL   RBC 4.65 4.14 - 5.80 x10E6/uL   Hemoglobin 14.5 13.0 - 17.7 g/dL   Hematocrit 44.0 37.5 - 51.0 %   MCV 95 79 - 97 fL   MCH 31.2 26.6 - 33.0 pg   MCHC 33.0 31.5 - 35.7 g/dL   RDW 11.3 (L) 11.6 - 15.4 %   Platelets 256 150 - 450 x10E3/uL   Neutrophils 51 Not Estab. %   Lymphs 34 Not Estab. %   Monocytes 12 Not Estab. %   Eos 2 Not Estab. %   Basos 1 Not Estab. %   Neutrophils Absolute 2.6 1.4 - 7.0 x10E3/uL   Lymphocytes Absolute 1.7 0.7 - 3.1 x10E3/uL   Monocytes Absolute 0.6 0.1 - 0.9 x10E3/uL   EOS (ABSOLUTE) 0.1 0.0 - 0.4 x10E3/uL   Basophils Absolute 0.0 0.0 - 0.2 x10E3/uL   Immature Granulocytes 0 Not Estab. %   Immature Grans (Abs) 0.0 0.0 - 0.1 x10E3/uL  Lipid Panel w/o Chol/HDL Ratio  Result Value Ref Range   Cholesterol, Total 184 100 - 199 mg/dL   Triglycerides 56 0 - 149 mg/dL   HDL 78 >39 mg/dL   VLDL Cholesterol Cal 11 5 - 40 mg/dL   LDL Chol Calc (NIH) 95 0 - 99 mg/dL  TSH  Result Value Ref Range   TSH 0.755 0.450 - 4.500 uIU/mL  Urinalysis, Routine w reflex microscopic  Result Value Ref Range   Specific Gravity, UA 1.020 1.005 - 1.030   pH, UA 5.5 5.0 - 7.5   Color, UA Yellow Yellow   Appearance Ur Clear Clear   Leukocytes,UA Negative Negative   Protein,UA Trace (A) Negative/Trace   Glucose, UA Negative Negative   Ketones, UA Negative Negative   RBC, UA Negative Negative   Bilirubin, UA Negative Negative   Urobilinogen,  Ur 1.0 0.2 - 1.0 mg/dL   Nitrite, UA Negative Negative  HIV Antibody (routine testing w rflx)  Result Value Ref Range   HIV Screen 4th  Generation wRfx Non Reactive Non Reactive  RPR  Result Value Ref Range   RPR Ser Ql Reactive (A) Non Reactive  HSV(herpes simplex vrs) 1+2 ab-IgG  Result Value Ref Range   HSV 1 Glycoprotein G Ab, IgG 46.20 (H) 0.00 - 0.90 index   HSV 2 IgG, Type Spec <0.91 0.00 - 0.90 index  Acute Viral Hepatitis (HAV, HBV, HCV)  Result Value Ref Range   Hep A IgM Negative Negative   Hepatitis B Surface Ag Negative Negative   Hep B C IgM Negative Negative   HCV Ab 0.1 0.0 - 0.9 s/co ratio  Interpretation:  Result Value Ref Range   HCV Interp 1: Comment   RPR, quant & T.pallidum antibodies  Result Value Ref Range   Rapid Plasma Reagin, Quant 1:1 (H) NonRea<1:1   T Pallidum Abs Non Reactive Non Reactive      Assessment & Plan:   Problem List Items Addressed This Visit       Nervous and Auditory   TBI (traumatic brain injury) - Primary    Talking with counselor. Feeling well. No concerns. Back to himself.       Other Visit Diagnoses     Impacted cerumen of right ear       Flushed today with good results.    Needs flu shot            Follow up plan: No follow-ups on file.

## 2020-11-10 NOTE — Assessment & Plan Note (Signed)
Talking with counselor. Feeling well. No concerns. Back to himself.

## 2020-12-28 DIAGNOSIS — F439 Reaction to severe stress, unspecified: Secondary | ICD-10-CM | POA: Diagnosis not present

## 2021-01-04 DIAGNOSIS — F439 Reaction to severe stress, unspecified: Secondary | ICD-10-CM | POA: Diagnosis not present

## 2021-01-11 DIAGNOSIS — F439 Reaction to severe stress, unspecified: Secondary | ICD-10-CM | POA: Diagnosis not present

## 2021-01-18 DIAGNOSIS — F439 Reaction to severe stress, unspecified: Secondary | ICD-10-CM | POA: Diagnosis not present

## 2021-01-22 DIAGNOSIS — F439 Reaction to severe stress, unspecified: Secondary | ICD-10-CM | POA: Diagnosis not present

## 2021-01-25 DIAGNOSIS — F439 Reaction to severe stress, unspecified: Secondary | ICD-10-CM | POA: Diagnosis not present

## 2021-02-01 DIAGNOSIS — F439 Reaction to severe stress, unspecified: Secondary | ICD-10-CM | POA: Diagnosis not present

## 2021-02-02 ENCOUNTER — Ambulatory Visit: Payer: Self-pay | Admitting: *Deleted

## 2021-02-02 ENCOUNTER — Telehealth (INDEPENDENT_AMBULATORY_CARE_PROVIDER_SITE_OTHER): Payer: Medicaid Other | Admitting: Nurse Practitioner

## 2021-02-02 ENCOUNTER — Encounter: Payer: Self-pay | Admitting: Nurse Practitioner

## 2021-02-02 DIAGNOSIS — U071 COVID-19: Secondary | ICD-10-CM | POA: Diagnosis not present

## 2021-02-02 NOTE — Telephone Encounter (Signed)
Summary: COVID Positive, cough   Patient dx with COVID last night.requesting COVID pill. Patient experiencing a cough.      Reason for Disposition  [1] COVID-19 diagnosed by positive lab test (e.g., PCR, rapid self-test kit) AND [2] mild symptoms (e.g., cough, fever, others) AND [4] no complications or SOB  Answer Assessment - Initial Assessment Questions 1. COVID-19 DIAGNOSIS: "Who made your COVID-19 diagnosis?" "Was it confirmed by a positive lab test or self-test?" If not diagnosed by a doctor (or NP/PA), ask "Are there lots of cases (community spread) where you live?" Note: See public health department website, if unsure.     + COVID home test- last night 2. COVID-19 EXPOSURE: "Was there any known exposure to COVID before the symptoms began?" CDC Definition of close contact: within 6 feet (2 meters) for a total of 15 minutes or more over a 24-hour period.      Home exposure 3. ONSET: "When did the COVID-19 symptoms start?"      Symptoms started yesterday- cough 4. WORST SYMPTOM: "What is your worst symptom?" (e.g., cough, fever, shortness of breath, muscle aches)     Cough, headache 5. COUGH: "Do you have a cough?" If Yes, ask: "How bad is the cough?"       Yes- unsure 6. FEVER: "Do you have a fever?" If Yes, ask: "What is your temperature, how was it measured, and when did it start?"     no 7. RESPIRATORY STATUS: "Describe your breathing?" (e.g., shortness of breath, wheezing, unable to speak)      normal 8. BETTER-SAME-WORSE: "Are you getting better, staying the same or getting worse compared to yesterday?"  If getting worse, ask, "In what way?"     same 9. HIGH RISK DISEASE: "Do you have any chronic medical problems?" (e.g., asthma, heart or lung disease, weak immune system, obesity, etc.)     no 10. VACCINE: "Have you had the COVID-19 vaccine?" If Yes, ask: "Which one, how many shots, when did you get it?"       yes 11. BOOSTER: "Have you received your COVID-19 booster?" If Yes,  ask: "Which one and when did you get it?"       Yes- aprox.4-6 months ago 12. PREGNANCY: "Is there any chance you are pregnant?" "When was your last menstrual period?"       na 13. OTHER SYMPTOMS: "Do you have any other symptoms?"  (e.g., chills, fatigue, headache, loss of smell or taste, muscle pain, sore throat)       No other symptoms 14. O2 SATURATION MONITOR:  "Do you use an oxygen saturation monitor (pulse oximeter) at home?" If Yes, ask "What is your reading (oxygen level) today?" "What is your usual oxygen saturation reading?" (e.g., 95%)       *No Answer*  Protocols used: Coronavirus (COVID-19) Diagnosed or Suspected-A-AH

## 2021-02-02 NOTE — Telephone Encounter (Signed)
Appt scheduled

## 2021-02-02 NOTE — Progress Notes (Signed)
There were no vitals taken for this visit.   Subjective:    Patient ID: Danny Valdez, male    DOB: 03/04/1985, 36 y.o.   MRN: [id number]  HPI: Danny Valdez is a 36 y.o. male  Chief Complaint  Patient presents with   Covid Positive    Pt state he tested positive for covid last night. States he started having symptoms early this week. States he has had a cough, sore throat and congestion.    COVID POSITIVE Pt state he tested positive for covid last night. States his symptoms started last week.  States he has had a cough, sore throat and congestion.  Denies fever, SOB, wheezing, chest pain, or tightness.     Relevant past medical, surgical, family and social history reviewed and updated as indicated. Interim medical history since our last visit reviewed. Allergies and medications reviewed and updated.  Review of Systems  Constitutional:  Negative for fever.  HENT:  Positive for congestion and sore throat.   Respiratory:  Positive for cough. Negative for chest tightness, shortness of breath and wheezing.   Cardiovascular:  Negative for chest pain.   Per HPI unless specifically indicated above     Objective:    There were no vitals taken for this visit.  Wt Readings from Last 3 Encounters:  11/10/20 144 lb (65.3 kg)  06/22/20 143 lb (64.9 kg)  05/24/19 141 lb 3.2 oz (64 kg)    Physical Exam Vitals and nursing note reviewed.  Constitutional:      General: He is not in acute distress.    Appearance: He is not ill-appearing.  HENT:     Head: Normocephalic.     Right Ear: Hearing normal.     Left Ear: Hearing normal.     Nose: Nose normal.  Pulmonary:     Effort: Pulmonary effort is normal. No respiratory distress.  Neurological:     Mental Status: He is alert.  Psychiatric:        Mood and Affect: Mood normal.        Behavior: Behavior normal.        Thought Content: Thought content normal.        Judgment: Judgment normal.    Results for orders placed or  performed in visit on 06/22/20  GC/Chlamydia Probe Amp   Specimen: Urine   UR  Result Value Ref Range   Chlamydia trachomatis, NAA Negative Negative   Neisseria Gonorrhoeae by PCR Negative Negative  Comprehensive metabolic panel  Result Value Ref Range   Glucose 95 65 - 99 mg/dL   BUN 14 6 - 20 mg/dL   Creatinine, Ser 1.40 (H) 0.76 - 1.27 mg/dL   eGFR 67 >59 mL/min/1.73   BUN/Creatinine Ratio 10 9 - 20   Sodium 138 134 - 144 mmol/L   Potassium 4.0 3.5 - 5.2 mmol/L   Chloride 99 96 - 106 mmol/L   CO2 25 20 - 29 mmol/L   Calcium 9.5 8.7 - 10.2 mg/dL   Total Protein 7.7 6.0 - 8.5 g/dL   Albumin 5.0 4.0 - 5.0 g/dL   Globulin, Total 2.7 1.5 - 4.5 g/dL   Albumin/Globulin Ratio 1.9 1.2 - 2.2   Bilirubin Total 2.8 (H) 0.0 - 1.2 mg/dL   Alkaline Phosphatase 51 44 - 121 IU/L   AST 39 0 - 40 IU/L   ALT 16 0 - 44 IU/L  CBC with Differential/Platelet  Result Value Ref Range   WBC 5.1 3.4 -  10.8 x10E3/uL   RBC 4.65 4.14 - 5.80 x10E6/uL   Hemoglobin 14.5 13.0 - 17.7 g/dL   Hematocrit 44.0 37.5 - 51.0 %   MCV 95 79 - 97 fL   MCH 31.2 26.6 - 33.0 pg   MCHC 33.0 31.5 - 35.7 g/dL   RDW 11.3 (L) 11.6 - 15.4 %   Platelets 256 150 - 450 x10E3/uL   Neutrophils 51 Not Estab. %   Lymphs 34 Not Estab. %   Monocytes 12 Not Estab. %   Eos 2 Not Estab. %   Basos 1 Not Estab. %   Neutrophils Absolute 2.6 1.4 - 7.0 x10E3/uL   Lymphocytes Absolute 1.7 0.7 - 3.1 x10E3/uL   Monocytes Absolute 0.6 0.1 - 0.9 x10E3/uL   EOS (ABSOLUTE) 0.1 0.0 - 0.4 x10E3/uL   Basophils Absolute 0.0 0.0 - 0.2 x10E3/uL   Immature Granulocytes 0 Not Estab. %   Immature Grans (Abs) 0.0 0.0 - 0.1 x10E3/uL  Lipid Panel w/o Chol/HDL Ratio  Result Value Ref Range   Cholesterol, Total 184 100 - 199 mg/dL   Triglycerides 56 0 - 149 mg/dL   HDL 78 >39 mg/dL   VLDL Cholesterol Cal 11 5 - 40 mg/dL   LDL Chol Calc (NIH) 95 0 - 99 mg/dL  TSH  Result Value Ref Range   TSH 0.755 0.450 - 4.500 uIU/mL  Urinalysis, Routine w  reflex microscopic  Result Value Ref Range   Specific Gravity, UA 1.020 1.005 - 1.030   pH, UA 5.5 5.0 - 7.5   Color, UA Yellow Yellow   Appearance Ur Clear Clear   Leukocytes,UA Negative Negative   Protein,UA Trace (A) Negative/Trace   Glucose, UA Negative Negative   Ketones, UA Negative Negative   RBC, UA Negative Negative   Bilirubin, UA Negative Negative   Urobilinogen, Ur 1.0 0.2 - 1.0 mg/dL   Nitrite, UA Negative Negative  HIV Antibody (routine testing w rflx)  Result Value Ref Range   HIV Screen 4th Generation wRfx Non Reactive Non Reactive  RPR  Result Value Ref Range   RPR Ser Ql Reactive (A) Non Reactive  HSV(herpes simplex vrs) 1+2 ab-IgG  Result Value Ref Range   HSV 1 Glycoprotein G Ab, IgG 46.20 (H) 0.00 - 0.90 index   HSV 2 IgG, Type Spec <0.91 0.00 - 0.90 index  Acute Viral Hepatitis (HAV, HBV, HCV)  Result Value Ref Range   Hep A IgM Negative Negative   Hepatitis B Surface Ag Negative Negative   Hep B C IgM Negative Negative   HCV Ab 0.1 0.0 - 0.9 s/co ratio  Interpretation:  Result Value Ref Range   HCV Interp 1: Comment   RPR, quant & T.pallidum antibodies  Result Value Ref Range   Rapid Plasma Reagin, Quant 1:1 (H) NonRea<1:1   T Pallidum Abs Non Reactive Non Reactive      Assessment & Plan:   Problem List Items Addressed This Visit   None Visit Diagnoses     COVID-19    -  Primary   Symptoms have been going on for over a week and are improving. Recommend OTC symptom management. Discussed quarnatine and masking. FU if not improved.        Follow up plan: Return if symptoms worsen or fail to improve.   This visit was completed via MyChart due to the restrictions of the COVID-19 pandemic. All issues as above were discussed and addressed. Physical exam was done as above through visual confirmation on  MyChart. If it was felt that the patient should be evaluated in the office, they were directed there. The patient verbally consented to this  visit. Location of the patient: Home Location of the provider: Office Those involved with this call:  Provider: Jon Billings, NP CMA: Yvonna Alanis, [place] Desk/Registration: Myrlene Broker This encounter was conducted via video.  I spent 15 dedicated to the care of this patient on the date of this encounter to include previsit review of 20, face to face time with the patient, and post visit ordering of testing.

## 2021-02-02 NOTE — Telephone Encounter (Signed)
°  Chief Complaint: + COVID- father calling Symptoms: cough, headache Frequency: symptoms started yesterday Pertinent Negatives: Patient denies fever,SOB Disposition: [] ED /[] Urgent Care (no appt availability in office) / [] Appointment(In office/virtual)/ []  [facility]/ [x] Home Care/ [] Refused Recommended Disposition /[] [facility]/ []  Follow-up with PCP Additional Notes: + COVID, low risk, cough,headache- started yesterday. Father calling for treatment: Advised per COVID protocol:  treatment/isolation, requesting antiviral treatment. Will send to office for PCP review of request

## 2021-02-08 DIAGNOSIS — F439 Reaction to severe stress, unspecified: Secondary | ICD-10-CM | POA: Diagnosis not present

## 2021-02-15 DIAGNOSIS — F439 Reaction to severe stress, unspecified: Secondary | ICD-10-CM | POA: Diagnosis not present

## 2021-02-22 DIAGNOSIS — F439 Reaction to severe stress, unspecified: Secondary | ICD-10-CM | POA: Diagnosis not present

## 2021-03-01 DIAGNOSIS — F439 Reaction to severe stress, unspecified: Secondary | ICD-10-CM | POA: Diagnosis not present

## 2021-03-08 DIAGNOSIS — F439 Reaction to severe stress, unspecified: Secondary | ICD-10-CM | POA: Diagnosis not present

## 2021-03-15 DIAGNOSIS — F439 Reaction to severe stress, unspecified: Secondary | ICD-10-CM | POA: Diagnosis not present

## 2021-03-22 DIAGNOSIS — F439 Reaction to severe stress, unspecified: Secondary | ICD-10-CM | POA: Diagnosis not present

## 2021-03-29 DIAGNOSIS — F439 Reaction to severe stress, unspecified: Secondary | ICD-10-CM | POA: Diagnosis not present

## 2021-04-05 DIAGNOSIS — F439 Reaction to severe stress, unspecified: Secondary | ICD-10-CM | POA: Diagnosis not present
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
## 2021-05-10 DIAGNOSIS — F439 Reaction to severe stress, unspecified: Secondary | ICD-10-CM | POA: Diagnosis not present

## 2021-05-17 DIAGNOSIS — F439 Reaction to severe stress, unspecified: Secondary | ICD-10-CM | POA: Diagnosis not present

## 2021-06-21 DIAGNOSIS — F439 Reaction to severe stress, unspecified: Secondary | ICD-10-CM | POA: Diagnosis not present

## 2021-07-12 ENCOUNTER — Encounter: Payer: Self-pay | Admitting: Family Medicine

## 2021-07-12 ENCOUNTER — Ambulatory Visit (INDEPENDENT_AMBULATORY_CARE_PROVIDER_SITE_OTHER): Payer: Medicaid Other | Admitting: Family Medicine

## 2021-07-12 VITALS — BP 110/66 | HR 60 | Temp 98.0°F | Ht 70.0 in | Wt 141.8 lb

## 2021-07-12 DIAGNOSIS — Z0000 Encounter for general adult medical examination without abnormal findings: Secondary | ICD-10-CM

## 2021-07-12 DIAGNOSIS — H538 Other visual disturbances: Secondary | ICD-10-CM

## 2021-07-12 DIAGNOSIS — B070 Plantar wart: Secondary | ICD-10-CM | POA: Diagnosis not present

## 2021-07-12 DIAGNOSIS — S069X0S Unspecified intracranial injury without loss of consciousness, sequela: Secondary | ICD-10-CM

## 2021-07-12 LAB — URINALYSIS, ROUTINE W REFLEX MICROSCOPIC
Bilirubin, UA: NEGATIVE
Glucose, UA: NEGATIVE
Ketones, UA: NEGATIVE
Leukocytes,UA: NEGATIVE
Nitrite, UA: NEGATIVE
Protein,UA: NEGATIVE
RBC, UA: NEGATIVE
Specific Gravity, UA: 1.015 (ref 1.005–1.030)
Urobilinogen, Ur: 1 mg/dL (ref 0.2–1.0)
pH, UA: 6 (ref 5.0–7.5)

## 2021-07-12 NOTE — Progress Notes (Signed)
BP 110/66   Pulse 60   Temp 98 F (36.7 C)   Ht _0  (1.778 m)   Wt 141 lb 12.8 oz (64.3 kg)   SpO2 96%   BMI 20.35 kg/m    Subjective:    Patient ID: Danny Valdez, male    DOB: 05-14-1985, 36 y.o.   MRN: [id number]  HPI: Danny Valdez is a 36 y.o. male presenting on 07/12/2021 for comprehensive medical examination. Current medical complaints include:none  He currently lives with: dad and step mom Interim Problems from his last visit: no  Depression Screen done today and results listed below:     07/12/2021    3:47 PM 11/10/2020    4:13 PM 06/22/2020    2:56 PM 06/22/2020    2:54 PM 05/24/2019   11:06 AM  Depression screen PHQ 2/9  Decreased Interest 0 0 0 0 0  Down, Depressed, Hopeless 0 0 1 1 0  PHQ - 2 Score 0 0 1 1 0  Altered sleeping 0 0 0  0  Tired, decreased energy 0 0 0  0  Change in appetite 0 0 0  0  Feeling bad or failure about yourself  0 0 1  1  Trouble concentrating 0 0 1  0  Moving slowly or fidgety/restless 0 0 1  0  Suicidal thoughts 0 0 0  0  PHQ-9 Score 0 0 4  1  Difficult doing work/chores Not difficult at all  Not difficult at all  Not difficult at all    Past Medical History:  Past Medical History:  Diagnosis Date   ADHD (attention deficit hyperactivity disorder)    Allergy    Anxiety    Brain damage    Depression    Seizures ([place])     Surgical History:  History reviewed. No pertinent surgical history.  Medications:  No current outpatient medications on file prior to visit.   No current facility-administered medications on file prior to visit.    Allergies:  No Known Allergies  Social History:  Social History   Socioeconomic History   Marital status: Single    Spouse name: Not on file   Number of children: Not on file   Years of education: Not on file   Highest education level: Not on file  Occupational History   Not on file  Tobacco Use   Smoking status: Former    Types: Cigarettes    Quit date: 04/05/2014     Years since quitting: 7.2   Smokeless tobacco: Never  Vaping Use   Vaping Use: Former  Substance and Sexual Activity   Alcohol use: Yes    Alcohol/week: 4.0 standard drinks of alcohol    Types: 4 Cans of beer per week    Comment: Occasionally/Rarely   Drug use: Not Currently    Types: Marijuana    Comment: occ   Sexual activity: Not Currently  Other Topics Concern   Not on file  Social History Narrative   Not on file   Social Determinants of Health   Financial Resource Strain: Not on file  Food Insecurity: Not on file  Transportation Needs: Not on file  Physical Activity: Not on file  Stress: Not on file  Social Connections: Not on file  Intimate Partner Violence: Not on file   Social History   Tobacco Use  Smoking Status Former   Types: Cigarettes   Quit date: 04/05/2014   Years since quitting: 7.2  Smokeless Tobacco  Never   Social History   Substance and Sexual Activity  Alcohol Use Yes   Alcohol/week: 4.0 standard drinks of alcohol   Types: 4 Cans of beer per week   Comment: Occasionally/Rarely    Family History:  Family History  Problem Relation Age of Onset   Hypertension Father    Diabetes Maternal Grandmother    Hypertension Maternal Grandmother    Diabetes Paternal Grandmother    Gout Paternal Grandfather    Diabetes Paternal Grandfather    Hypertension Paternal Grandfather    Aneurysm Paternal Grandfather     Past medical history, surgical history, medications, allergies, family history and social history reviewed with patient today and changes made to appropriate areas of the chart.   Review of Systems  Constitutional: Negative.   HENT: Negative.    Eyes: Negative.   Respiratory: Negative.    Cardiovascular: Negative.   Gastrointestinal: Negative.   Genitourinary: Negative.   Musculoskeletal: Negative.   Skin: Negative.   Neurological: Negative.   Endo/Heme/Allergies: Negative.   Psychiatric/Behavioral: Negative.     All other ROS  negative except what is listed above and in the HPI.      Objective:    BP 110/66   Pulse 60   Temp 98 F (36.7 C)   Ht _0  (1.778 m)   Wt 141 lb 12.8 oz (64.3 kg)   SpO2 96%   BMI 20.35 kg/m   Wt Readings from Last 3 Encounters:  07/12/21 141 lb 12.8 oz (64.3 kg)  11/10/20 144 lb (65.3 kg)  06/22/20 143 lb (64.9 kg)    Physical Exam Vitals and nursing note reviewed.  Constitutional:      General: He is not in acute distress.    Appearance: Normal appearance. He is normal weight. He is not ill-appearing, toxic-appearing or diaphoretic.  HENT:     Head: Normocephalic and atraumatic.     Right Ear: Tympanic membrane, ear canal and external ear normal. There is no impacted cerumen.     Left Ear: Tympanic membrane, ear canal and external ear normal. There is no impacted cerumen.     Nose: Nose normal. No congestion or rhinorrhea.     Mouth/Throat:     Mouth: Mucous membranes are moist.     Pharynx: Oropharynx is clear. No oropharyngeal exudate or posterior oropharyngeal erythema.  Eyes:     General: No scleral icterus.       Right eye: No discharge.        Left eye: No discharge.     Extraocular Movements: Extraocular movements intact.     Conjunctiva/sclera: Conjunctivae normal.     Pupils: Pupils are equal, round, and reactive to light.  Neck:     Vascular: No carotid bruit.  Cardiovascular:     Rate and Rhythm: Normal rate and regular rhythm.     Pulses: Normal pulses.     Heart sounds: No murmur heard.    No friction rub. No gallop.  Pulmonary:     Effort: Pulmonary effort is normal. No respiratory distress.     Breath sounds: Normal breath sounds. No stridor. No wheezing, rhonchi or rales.  Chest:     Chest wall: No tenderness.  Abdominal:     General: Abdomen is flat. Bowel sounds are normal. There is no distension.     Palpations: Abdomen is soft. There is no mass.     Tenderness: There is no abdominal tenderness. There is no right CVA tenderness, left CVA  tenderness, guarding  or rebound.     Hernia: No hernia is present.  Genitourinary:    Comments: Genital exam deferred with shared decision making Musculoskeletal:        General: No swelling, tenderness, deformity or signs of injury.     Cervical back: Normal range of motion and neck supple. No rigidity. No muscular tenderness.     Right lower leg: No edema.     Left lower leg: No edema.  Lymphadenopathy:     Cervical: No cervical adenopathy.  Skin:    General: Skin is warm and dry.     Capillary Refill: Capillary refill takes less than 2 seconds.     Coloration: Skin is not jaundiced or pale.     Findings: No bruising, erythema, lesion or rash.  Neurological:     General: No focal deficit present.     Mental Status: He is alert and oriented to person, place, and time.     Cranial Nerves: No cranial nerve deficit.     Sensory: No sensory deficit.     Motor: No weakness.     Coordination: Coordination normal.     Gait: Gait normal.     Deep Tendon Reflexes: Reflexes normal.  Psychiatric:        Mood and Affect: Mood normal.        Behavior: Behavior normal.        Thought Content: Thought content normal.        Judgment: Judgment normal.     Results for orders placed or performed in visit on 06/22/20  GC/Chlamydia Probe Amp   Specimen: Urine   UR  Result Value Ref Range   Chlamydia trachomatis, NAA Negative Negative   Neisseria Gonorrhoeae by PCR Negative Negative  Comprehensive metabolic panel  Result Value Ref Range   Glucose 95 65 - 99 mg/dL   BUN 14 6 - 20 mg/dL   Creatinine, Ser 1.40 (H) 0.76 - 1.27 mg/dL   eGFR 67 >59 mL/min/1.73   BUN/Creatinine Ratio 10 9 - 20   Sodium 138 134 - 144 mmol/L   Potassium 4.0 3.5 - 5.2 mmol/L   Chloride 99 96 - 106 mmol/L   CO2 25 20 - 29 mmol/L   Calcium 9.5 8.7 - 10.2 mg/dL   Total Protein 7.7 6.0 - 8.5 g/dL   Albumin 5.0 4.0 - 5.0 g/dL   Globulin, Total 2.7 1.5 - 4.5 g/dL   Albumin/Globulin Ratio 1.9 1.2 - 2.2   Bilirubin  Total 2.8 (H) 0.0 - 1.2 mg/dL   Alkaline Phosphatase 51 44 - 121 IU/L   AST 39 0 - 40 IU/L   ALT 16 0 - 44 IU/L  CBC with Differential/Platelet  Result Value Ref Range   WBC 5.1 3.4 - 10.8 x10E3/uL   RBC 4.65 4.14 - 5.80 x10E6/uL   Hemoglobin 14.5 13.0 - 17.7 g/dL   Hematocrit 44.0 37.5 - 51.0 %   MCV 95 79 - 97 fL   MCH 31.2 26.6 - 33.0 pg   MCHC 33.0 31.5 - 35.7 g/dL   RDW 11.3 (L) 11.6 - 15.4 %   Platelets 256 150 - 450 x10E3/uL   Neutrophils 51 Not Estab. %   Lymphs 34 Not Estab. %   Monocytes 12 Not Estab. %   Eos 2 Not Estab. %   Basos 1 Not Estab. %   Neutrophils Absolute 2.6 1.4 - 7.0 x10E3/uL   Lymphocytes Absolute 1.7 0.7 - 3.1 x10E3/uL   Monocytes Absolute 0.6 0.1 -  0.9 x10E3/uL   EOS (ABSOLUTE) 0.1 0.0 - 0.4 x10E3/uL   Basophils Absolute 0.0 0.0 - 0.2 x10E3/uL   Immature Granulocytes 0 Not Estab. %   Immature Grans (Abs) 0.0 0.0 - 0.1 x10E3/uL  Lipid Panel w/o Chol/HDL Ratio  Result Value Ref Range   Cholesterol, Total 184 100 - 199 mg/dL   Triglycerides 56 0 - 149 mg/dL   HDL 78 >39 mg/dL   VLDL Cholesterol Cal 11 5 - 40 mg/dL   LDL Chol Calc (NIH) 95 0 - 99 mg/dL  TSH  Result Value Ref Range   TSH 0.755 0.450 - 4.500 uIU/mL  Urinalysis, Routine w reflex microscopic  Result Value Ref Range   Specific Gravity, UA 1.020 1.005 - 1.030   pH, UA 5.5 5.0 - 7.5   Color, UA Yellow Yellow   Appearance Ur Clear Clear   Leukocytes,UA Negative Negative   Protein,UA Trace (A) Negative/Trace   Glucose, UA Negative Negative   Ketones, UA Negative Negative   RBC, UA Negative Negative   Bilirubin, UA Negative Negative   Urobilinogen, Ur 1.0 0.2 - 1.0 mg/dL   Nitrite, UA Negative Negative  HIV Antibody (routine testing w rflx)  Result Value Ref Range   HIV Screen 4th Generation wRfx Non Reactive Non Reactive  RPR  Result Value Ref Range   RPR Ser Ql Reactive (A) Non Reactive  HSV(herpes simplex vrs) 1+2 ab-IgG  Result Value Ref Range   HSV 1 Glycoprotein G Ab,  IgG 46.20 (H) 0.00 - 0.90 index   HSV 2 IgG, Type Spec <0.91 0.00 - 0.90 index  Acute Viral Hepatitis (HAV, HBV, HCV)  Result Value Ref Range   Hep A IgM Negative Negative   Hepatitis B Surface Ag Negative Negative   Hep B C IgM Negative Negative   HCV Ab 0.1 0.0 - 0.9 s/co ratio  Interpretation:  Result Value Ref Range   HCV Interp 1: Comment   RPR, quant & T.pallidum antibodies  Result Value Ref Range   Rapid Plasma Reagin, Quant 1:1 (H) NonRea<1:1   T Pallidum Abs Non Reactive Non Reactive      Assessment & Plan:   Problem List Items Addressed This Visit       Nervous and Auditory   TBI (traumatic brain injury) ([place])    Doing well. No concerns.       Other Visit Diagnoses     Routine general medical examination at a health care facility    -  Primary   Vaccines up to date. Screening labs checked today. Continue diet and exercise. Call with any concerns.    Relevant Orders   Comprehensive metabolic panel   CBC with Differential/Platelet   Lipid Panel w/o Chol/HDL Ratio   TSH   Urinalysis, Routine w reflex microscopic   HIV Antibody (routine testing w rflx)   RPR   GC/Chlamydia Probe Amp   HSV(herpes simplex vrs) 1+2 ab-IgG   Acute Viral Hepatitis (HAV, HBV, HCV)   Blurred vision       Referral to opthalmology made today.   Relevant Orders   Ambulatory referral to Ophthalmology   Plantar wart       Referral to podiatry made today.   Relevant Orders   Ambulatory referral to Podiatry       LABORATORY TESTING:  Health maintenance labs ordered today as discussed above.   IMMUNIZATIONS:   - Tdap: Tetanus vaccination status reviewed: last tetanus booster within 10 years. - Influenza: Postponed to flu  season - Pneumovax: Not applicable - Prevnar: Not applicable - COVID: Refused - HPV: Not applicable  PATIENT COUNSELING:    Sexuality: Discussed sexually transmitted diseases, partner selection, use of condoms, avoidance of unintended pregnancy  and  contraceptive alternatives.   Advised to avoid cigarette smoking.  I discussed with the patient that most people either abstain from alcohol or drink within safe limits (<=14/week and <=4 drinks/occasion for males, <=7/weeks and <= 3 drinks/occasion for females) and that the risk for alcohol disorders and other health effects rises proportionally with the number of drinks per week and how often a drinker exceeds daily limits.  Discussed cessation/primary prevention of drug use and availability of treatment for abuse.   Diet: Encouraged to adjust caloric intake to maintain  or achieve ideal body weight, to reduce intake of dietary saturated fat and total fat, to limit sodium intake by avoiding high sodium foods and not adding table salt, and to maintain adequate dietary potassium and calcium preferably from fresh fruits, vegetables, and low-fat dairy products.    stressed the importance of regular exercise  Injury prevention: Discussed safety belts, safety helmets, smoke detector, smoking near bedding or upholstery.   Dental health: Discussed importance of regular tooth brushing, flossing, and dental visits.   Follow up plan: NEXT PREVENTATIVE PHYSICAL DUE IN 1 YEAR. Return in about 1 year (around 07/13/2022) for physical.

## 2021-07-12 NOTE — Assessment & Plan Note (Signed)
Doing well.  No concerns.

## 2021-07-13 ENCOUNTER — Encounter: Payer: Self-pay | Admitting: Family Medicine

## 2021-07-13 LAB — COMPREHENSIVE METABOLIC PANEL
ALT: 10 IU/L (ref 0–44)
AST: 14 IU/L (ref 0–40)
Albumin/Globulin Ratio: 1.6 (ref 1.2–2.2)
Albumin: 4.7 g/dL (ref 4.0–5.0)
Alkaline Phosphatase: 51 IU/L (ref 44–121)
BUN/Creatinine Ratio: 10 (ref 9–20)
BUN: 10 mg/dL (ref 6–20)
Bilirubin Total: 1.8 mg/dL — ABNORMAL HIGH (ref 0.0–1.2)
CO2: 28 mmol/L (ref 20–29)
Calcium: 10.1 mg/dL (ref 8.7–10.2)
Chloride: 99 mmol/L (ref 96–106)
Creatinine, Ser: 0.99 mg/dL (ref 0.76–1.27)
Globulin, Total: 3 g/dL (ref 1.5–4.5)
Glucose: 91 mg/dL (ref 70–99)
Potassium: 4.5 mmol/L (ref 3.5–5.2)
Sodium: 138 mmol/L (ref 134–144)
Total Protein: 7.7 g/dL (ref 6.0–8.5)
eGFR: 101 mL/min/{1.73_m2} (ref >59)

## 2021-07-13 LAB — CBC WITH DIFFERENTIAL/PLATELET
Basophils Absolute: 0 10*3/uL (ref 0.0–0.2)
Basos: 0 %
EOS (ABSOLUTE): 0.1 10*3/uL (ref 0.0–0.4)
Eos: 4 %
Hematocrit: 43.9 % (ref 37.5–51.0)
Hemoglobin: 14.6 g/dL (ref 13.0–17.7)
Immature Grans (Abs): 0 10*3/uL (ref 0.0–0.1)
Immature Granulocytes: 0 %
Lymphocytes Absolute: 1.8 10*3/uL (ref 0.7–3.1)
Lymphs: 60 %
MCH: 31.1 pg (ref 26.6–33.0)
MCHC: 33.3 g/dL (ref 31.5–35.7)
MCV: 94 fL (ref 79–97)
Monocytes Absolute: 0.3 10*3/uL (ref 0.1–0.9)
Monocytes: 10 %
Neutrophils Absolute: 0.8 10*3/uL — ABNORMAL LOW (ref 1.4–7.0)
Neutrophils: 26 %
Platelets: 286 10*3/uL (ref 150–450)
RBC: 4.69 x10E6/uL (ref 4.14–5.80)
RDW: 11.3 % — ABNORMAL LOW (ref 11.6–15.4)
WBC: 3 10*3/uL — ABNORMAL LOW (ref 3.4–10.8)

## 2021-07-13 LAB — ACUTE VIRAL HEPATITIS (HAV, HBV, HCV)
HCV Ab: NONREACTIVE
Hep A IgM: NEGATIVE
Hep B C IgM: NEGATIVE
Hepatitis B Surface Ag: NEGATIVE

## 2021-07-13 LAB — LIPID PANEL W/O CHOL/HDL RATIO
Cholesterol, Total: 186 mg/dL (ref 100–199)
HDL: 75 mg/dL (ref >39)
LDL Chol Calc (NIH): 97 mg/dL (ref 0–99)
Triglycerides: 74 mg/dL (ref 0–149)
VLDL Cholesterol Cal: 14 mg/dL (ref 5–40)

## 2021-07-13 LAB — TSH: TSH: 1.97 u[IU]/mL (ref 0.450–4.500)

## 2021-07-13 LAB — HSV(HERPES SIMPLEX VRS) I + II AB-IGG
HSV 1 Glycoprotein G Ab, IgG: 54.6 index — ABNORMAL HIGH (ref 0.00–0.90)
HSV 2 IgG, Type Spec: <0.91 index (ref 0.00–0.90)

## 2021-07-13 LAB — HCV INTERPRETATION

## 2021-07-13 LAB — HIV ANTIBODY (ROUTINE TESTING W REFLEX): HIV Screen 4th Generation wRfx: NONREACTIVE

## 2021-07-13 LAB — RPR: RPR Ser Ql: NONREACTIVE

## 2021-07-14 LAB — GC/CHLAMYDIA PROBE AMP
Chlamydia trachomatis, NAA: NEGATIVE
Neisseria Gonorrhoeae by PCR: NEGATIVE

## 2021-10-02 ENCOUNTER — Ambulatory Visit: Payer: Medicaid Other | Admitting: Podiatry

## 2022-07-15 ENCOUNTER — Encounter: Payer: Medicaid Other | Admitting: Family Medicine

## 2022-12-21 DIAGNOSIS — J069 Acute upper respiratory infection, unspecified: Secondary | ICD-10-CM | POA: Diagnosis not present

## 2023-05-15 ENCOUNTER — Encounter: Payer: Self-pay | Admitting: Family Medicine

## 2023-05-15 ENCOUNTER — Ambulatory Visit: Admitting: Family Medicine

## 2023-05-15 VITALS — BP 125/81 | HR 73 | Ht 69.5 in | Wt 143.0 lb

## 2023-05-15 DIAGNOSIS — Z0000 Encounter for general adult medical examination without abnormal findings: Secondary | ICD-10-CM

## 2023-05-15 DIAGNOSIS — L910 Hypertrophic scar: Secondary | ICD-10-CM | POA: Diagnosis not present

## 2023-05-15 LAB — BAYER DCA HB A1C WAIVED: HB A1C (BAYER DCA - WAIVED): 4.4 % — ABNORMAL LOW (ref 4.8–5.6)

## 2023-05-15 NOTE — Progress Notes (Signed)
 BP 125/81 (BP Location: Left Arm, Patient Position: Sitting)   Pulse 73   Ht 5' 9.5" (1.765 m)   Wt 143 lb (64.9 kg)   SpO2 97%   BMI 20.81 kg/m    Subjective:    Patient ID: Danny Valdez, male    DOB: 08-13-1985, 38 y.o.   MRN: [id number]  HPI: Danny Valdez is a 38 y.o. male presenting on 05/15/2023 for comprehensive medical examination. Current medical complaints include: Has a keloid in the center of his chest which he would like to do something about. He also wants to make sure he doesn't have diabetes.   Interim Problems from his last visit: no  Depression Screen done today and results listed below:     05/15/2023    1:59 PM 07/12/2021    3:47 PM 11/10/2020    4:13 PM 06/22/2020    2:56 PM 06/22/2020    2:54 PM  Depression screen PHQ 2/9  Decreased Interest 0 0 0 0 0  Down, Depressed, Hopeless 1 0 0 1 1  PHQ - 2 Score 1 0 0 1 1  Altered sleeping 3 0 0 0   Tired, decreased energy 2 0 0 0   Change in appetite 0 0 0 0   Feeling bad or failure about yourself  2 0 0 1   Trouble concentrating 0 0 0 1   Moving slowly or fidgety/restless 0 0 0 1   Suicidal thoughts 0 0 0 0   PHQ-9 Score 8 0 0 4   Difficult doing work/chores Somewhat difficult Not difficult at all  Not difficult at all     Past Medical History:  Past Medical History:  Diagnosis Date   ADHD (attention deficit hyperactivity disorder)    Allergy    Anxiety    Brain damage    Depression    Seizures (HCC)     Surgical History:  History reviewed. No pertinent surgical history.  Medications:  No current outpatient medications on file prior to visit.   No current facility-administered medications on file prior to visit.    Allergies:  No Known Allergies  Social History:  Social History   Socioeconomic History   Marital status: Single    Spouse name: Not on file   Number of children: Not on file   Years of education: Not on file   Highest education level: Not on file  Occupational History    Not on file  Tobacco Use   Smoking status: Former    Current packs/day: 0.00    Types: Cigarettes    Quit date: 04/05/2014    Years since quitting: 9.1   Smokeless tobacco: Never  Vaping Use   Vaping status: Former  Substance and Sexual Activity   Alcohol use: Yes    Alcohol/week: 4.0 standard drinks of alcohol    Types: 4 Cans of beer per week    Comment: Occasionally/Rarely   Drug use: Not Currently    Types: Marijuana    Comment: occ   Sexual activity: Not Currently  Other Topics Concern   Not on file  Social History Narrative   Not on file   Social Drivers of Health   Financial Resource Strain: Not on file  Food Insecurity: Not on file  Transportation Needs: Not on file  Physical Activity: Not on file  Stress: Not on file  Social Connections: Not on file  Intimate Partner Violence: Not on file   Social History   Tobacco  Use  Smoking Status Former   Current packs/day: 0.00   Types: Cigarettes   Quit date: 04/05/2014   Years since quitting: 9.1  Smokeless Tobacco Never   Social History   Substance and Sexual Activity  Alcohol Use Yes   Alcohol/week: 4.0 standard drinks of alcohol   Types: 4 Cans of beer per week   Comment: Occasionally/Rarely    Family History:  Family History  Problem Relation Age of Onset   Hypertension Father    Diabetes Maternal Grandmother    Hypertension Maternal Grandmother    Diabetes Paternal Grandmother    Gout Paternal Grandfather    Diabetes Paternal Grandfather    Hypertension Paternal Grandfather    Aneurysm Paternal Grandfather     Past medical history, surgical history, medications, allergies, family history and social history reviewed with patient today and changes made to appropriate areas of the chart.   Review of Systems  Constitutional:  Positive for diaphoresis. Negative for chills, fever, malaise/fatigue and weight loss.  HENT:  Positive for hearing loss. Negative for congestion, ear discharge, ear pain,  nosebleeds, sinus pain, sore throat and tinnitus.   Eyes: Negative.   Respiratory: Negative.  Negative for stridor.   Cardiovascular: Negative.   Gastrointestinal: Negative.   Genitourinary: Negative.   Musculoskeletal:  Positive for back pain. Negative for falls, joint pain, myalgias and neck pain.  Skin: Negative.        Keloid in the center of his chest  Neurological:  Positive for dizziness (with heavy work in the heat). Negative for tingling, tremors, sensory change, speech change, focal weakness, seizures, loss of consciousness, weakness and headaches.  Endo/Heme/Allergies:  Positive for environmental allergies. Negative for polydipsia. Does not bruise/bleed easily.  Psychiatric/Behavioral: Negative.     All other ROS negative except what is listed above and in the HPI.      Objective:    BP 125/81 (BP Location: Left Arm, Patient Position: Sitting)   Pulse 73   Ht 5' 9.5" (1.765 m)   Wt 143 lb (64.9 kg)   SpO2 97%   BMI 20.81 kg/m   Wt Readings from Last 3 Encounters:  05/15/23 143 lb (64.9 kg)  07/12/21 141 lb 12.8 oz (64.3 kg)  11/10/20 144 lb (65.3 kg)    Hearing Screening   500Hz  1000Hz  2000Hz  4000Hz   Right ear 20 20 20 20   Left ear 20 20 20 20    Vision Screening   Right eye Left eye Both eyes  Without correction 20/20 20/20 20/20   With correction       Physical Exam Vitals and nursing note reviewed.  Constitutional:      General: He is not in acute distress.    Appearance: Normal appearance. He is not ill-appearing, toxic-appearing or diaphoretic.  HENT:     Head: Normocephalic and atraumatic.     Right Ear: Tympanic membrane, ear canal and external ear normal. There is no impacted cerumen.     Left Ear: Tympanic membrane, ear canal and external ear normal. There is no impacted cerumen.     Nose: Nose normal. No congestion or rhinorrhea.     Mouth/Throat:     Mouth: Mucous membranes are moist.     Pharynx: Oropharynx is clear. No oropharyngeal exudate  or posterior oropharyngeal erythema.  Eyes:     General: No scleral icterus.       Right eye: No discharge.        Left eye: No discharge.     Extraocular Movements:  Extraocular movements intact.     Conjunctiva/sclera: Conjunctivae normal.     Pupils: Pupils are equal, round, and reactive to light.  Neck:     Vascular: No carotid bruit.  Cardiovascular:     Rate and Rhythm: Normal rate and regular rhythm.     Pulses: Normal pulses.     Heart sounds: No murmur heard.    No friction rub. No gallop.  Pulmonary:     Effort: Pulmonary effort is normal. No respiratory distress.     Breath sounds: Normal breath sounds. No stridor. No wheezing, rhonchi or rales.  Chest:     Chest wall: No tenderness.  Abdominal:     General: Abdomen is flat. Bowel sounds are normal. There is no distension.     Palpations: Abdomen is soft. There is no mass.     Tenderness: There is no abdominal tenderness. There is no right CVA tenderness, left CVA tenderness, guarding or rebound.     Hernia: No hernia is present.  Genitourinary:    Comments: Genital exam deferred with shared decision making Musculoskeletal:        General: No swelling, tenderness, deformity or signs of injury. Normal range of motion.     Cervical back: Normal range of motion and neck supple. No rigidity. No muscular tenderness.     Right lower leg: No edema.     Left lower leg: No edema.  Lymphadenopathy:     Cervical: No cervical adenopathy.  Skin:    General: Skin is warm and dry.     Capillary Refill: Capillary refill takes less than 2 seconds.     Coloration: Skin is not jaundiced or pale.     Findings: No bruising, erythema, lesion or rash.     Comments: Keloid scar in center of his chest  Neurological:     General: No focal deficit present.     Mental Status: He is alert and oriented to person, place, and time.     Cranial Nerves: No cranial nerve deficit.     Sensory: No sensory deficit.     Motor: No weakness.      Coordination: Coordination normal.     Gait: Gait normal.     Deep Tendon Reflexes: Reflexes normal.  Psychiatric:        Mood and Affect: Mood normal.        Behavior: Behavior normal.        Thought Content: Thought content normal.        Judgment: Judgment normal.     Results for orders placed or performed in visit on 07/12/21  Urinalysis, Routine w reflex microscopic   Collection Time: 07/12/21  3:51 PM  Result Value Ref Range   Specific Gravity, UA 1.015 1.005 - 1.030   pH, UA 6.0 5.0 - 7.5   Color, UA Yellow Yellow   Appearance Ur Clear Clear   Leukocytes,UA Negative Negative   Protein,UA Negative Negative/Trace   Glucose, UA Negative Negative   Ketones, UA Negative Negative   RBC, UA Negative Negative   Bilirubin, UA Negative Negative   Urobilinogen, Ur 1.0 0.2 - 1.0 mg/dL   Nitrite, UA Negative Negative  Comprehensive metabolic panel   Collection Time: 07/12/21  3:55 PM  Result Value Ref Range   Glucose 91 70 - 99 mg/dL   BUN 10 6 - 20 mg/dL   Creatinine, Ser 1.61 0.76 - 1.27 mg/dL   eGFR 096 >04 VW/UJW/1.19   BUN/Creatinine Ratio 10 9 - 20  Sodium 138 134 - 144 mmol/L   Potassium 4.5 3.5 - 5.2 mmol/L   Chloride 99 96 - 106 mmol/L   CO2 28 20 - 29 mmol/L   Calcium 10.1 8.7 - 10.2 mg/dL   Total Protein 7.7 6.0 - 8.5 g/dL   Albumin 4.7 4.0 - 5.0 g/dL   Globulin, Total 3.0 1.5 - 4.5 g/dL   Albumin/Globulin Ratio 1.6 1.2 - 2.2   Bilirubin Total 1.8 (H) 0.0 - 1.2 mg/dL   Alkaline Phosphatase 51 44 - 121 IU/L   AST 14 0 - 40 IU/L   ALT 10 0 - 44 IU/L  CBC with Differential/Platelet   Collection Time: 07/12/21  3:55 PM  Result Value Ref Range   WBC 3.0 (L) 3.4 - 10.8 x10E3/uL   RBC 4.69 4.14 - 5.80 x10E6/uL   Hemoglobin 14.6 13.0 - 17.7 g/dL   Hematocrit 40.9 81.1 - 51.0 %   MCV 94 79 - 97 fL   MCH 31.1 26.6 - 33.0 pg   MCHC 33.3 31.5 - 35.7 g/dL   RDW 91.4 (L) 78.2 - 95.6 %   Platelets 286 150 - 450 x10E3/uL   Neutrophils 26 Not Estab. %   Lymphs 60 Not  Estab. %   Monocytes 10 Not Estab. %   Eos 4 Not Estab. %   Basos 0 Not Estab. %   Neutrophils Absolute 0.8 (L) 1.4 - 7.0 x10E3/uL   Lymphocytes Absolute 1.8 0.7 - 3.1 x10E3/uL   Monocytes Absolute 0.3 0.1 - 0.9 x10E3/uL   EOS (ABSOLUTE) 0.1 0.0 - 0.4 x10E3/uL   Basophils Absolute 0.0 0.0 - 0.2 x10E3/uL   Immature Granulocytes 0 Not Estab. %   Immature Grans (Abs) 0.0 0.0 - 0.1 x10E3/uL   Hematology Comments: Note:   Lipid Panel w/o Chol/HDL Ratio   Collection Time: 07/12/21  3:55 PM  Result Value Ref Range   Cholesterol, Total 186 100 - 199 mg/dL   Triglycerides 74 0 - 149 mg/dL   HDL 75 >21 mg/dL   VLDL Cholesterol Cal 14 5 - 40 mg/dL   LDL Chol Calc (NIH) 97 0 - 99 mg/dL  TSH   Collection Time: 07/12/21  3:55 PM  Result Value Ref Range   TSH 1.970 0.450 - 4.500 uIU/mL  HIV Antibody (routine testing w rflx)   Collection Time: 07/12/21  3:55 PM  Result Value Ref Range   HIV Screen 4th Generation wRfx Non Reactive Non Reactive  RPR   Collection Time: 07/12/21  3:55 PM  Result Value Ref Range   RPR Ser Ql Non Reactive Non Reactive  HSV(herpes simplex vrs) 1+2 ab-IgG   Collection Time: 07/12/21  3:55 PM  Result Value Ref Range   HSV 1 Glycoprotein G Ab, IgG 54.60 (H) 0.00 - 0.90 index   HSV 2 IgG, Type Spec <0.91 0.00 - 0.90 index  Acute Viral Hepatitis (HAV, HBV, HCV)   Collection Time: 07/12/21  3:55 PM  Result Value Ref Range   Hep A IgM Negative Negative   Hepatitis B Surface Ag Negative Negative   Hep B C IgM Negative Negative   HCV Ab Non Reactive Non Reactive  Interpretation:   Collection Time: 07/12/21  3:55 PM  Result Value Ref Range   HCV Interp 1: Comment   GC/Chlamydia Probe Amp   Collection Time: 07/12/21  4:16 PM   Specimen: Urine   UR  Result Value Ref Range   Chlamydia trachomatis, NAA Negative Negative   Neisseria Gonorrhoeae by PCR  Negative Negative      Assessment & Plan:   Problem List Items Addressed This Visit   None Visit Diagnoses        Routine general medical examination at a health care facility    -  Primary   Vaccines up to date. Screening labs checked today. Continue diet and exercise. Call with any concerns.   Relevant Orders   Comprehensive metabolic panel with GFR   CBC with Differential/Platelet   Lipid Panel w/o Chol/HDL Ratio   TSH   Bayer DCA Hb A1c Waived   HSV 1 and 2 Ab, IgG   HIV Antibody (routine testing w rflx)   RPR w/reflex to TrepSure   GC/Chlamydia Probe Amp   Acute Viral Hepatitis (HAV, HBV, HCV)     Keloid       Referral to dermatology placed today. Call with any concerns.   Relevant Orders   Ambulatory referral to Dermatology        LABORATORY TESTING:  Health maintenance labs ordered today as discussed above.   IMMUNIZATIONS:   - Tdap: Tetanus vaccination status reviewed: last tetanus booster within 10 years. - Influenza: Postponed to flu season - Pneumovax: Not applicable - Prevnar: Not applicable - COVID: Refused - HPV: Not applicable  PATIENT COUNSELING:    Sexuality: Discussed sexually transmitted diseases, partner selection, use of condoms, avoidance of unintended pregnancy  and contraceptive alternatives.   Advised to avoid cigarette smoking.  I discussed with the patient that most people either abstain from alcohol or drink within safe limits (<=14/week and <=4 drinks/occasion for males, <=7/weeks and <= 3 drinks/occasion for females) and that the risk for alcohol disorders and other health effects rises proportionally with the number of drinks per week and how often a drinker exceeds daily limits.  Discussed cessation/primary prevention of drug use and availability of treatment for abuse.   Diet: Encouraged to adjust caloric intake to maintain  or achieve ideal body weight, to reduce intake of dietary saturated fat and total fat, to limit sodium intake by avoiding high sodium foods and not adding table salt, and to maintain adequate dietary potassium and calcium  preferably from fresh fruits, vegetables, and low-fat dairy products.    stressed the importance of regular exercise  Injury prevention: Discussed safety belts, safety helmets, smoke detector, smoking near bedding or upholstery.   Dental health: Discussed importance of regular tooth brushing, flossing, and dental visits.   Follow up plan: NEXT PREVENTATIVE PHYSICAL DUE IN 1 YEAR. Return in about 6 months (around 11/14/2023).

## 2023-05-16 LAB — CBC WITH DIFFERENTIAL/PLATELET
Basophils Absolute: 0.1 10*3/uL (ref 0.0–0.2)
Basos: 1 %
EOS (ABSOLUTE): 0.2 10*3/uL (ref 0.0–0.4)
Eos: 5 %
Hematocrit: 44.7 % (ref 37.5–51.0)
Hemoglobin: 14.5 g/dL (ref 13.0–17.7)
Immature Grans (Abs): 0 10*3/uL (ref 0.0–0.1)
Immature Granulocytes: 0 %
Lymphocytes Absolute: 1.5 10*3/uL (ref 0.7–3.1)
Lymphs: 33 %
MCH: 31 pg (ref 26.6–33.0)
MCHC: 32.4 g/dL (ref 31.5–35.7)
MCV: 96 fL (ref 79–97)
Monocytes Absolute: 0.4 10*3/uL (ref 0.1–0.9)
Monocytes: 9 %
Neutrophils Absolute: 2.2 10*3/uL (ref 1.4–7.0)
Neutrophils: 52 %
Platelets: 303 10*3/uL (ref 150–450)
RBC: 4.67 x10E6/uL (ref 4.14–5.80)
RDW: 11.3 % — ABNORMAL LOW (ref 11.6–15.4)
WBC: 4.4 10*3/uL (ref 3.4–10.8)

## 2023-05-16 LAB — LIPID PANEL W/O CHOL/HDL RATIO
Cholesterol, Total: 212 mg/dL — ABNORMAL HIGH (ref 100–199)
HDL: 85 mg/dL (ref >39)
LDL Chol Calc (NIH): 112 mg/dL — ABNORMAL HIGH (ref 0–99)
Triglycerides: 88 mg/dL (ref 0–149)
VLDL Cholesterol Cal: 15 mg/dL (ref 5–40)

## 2023-05-16 LAB — COMPREHENSIVE METABOLIC PANEL WITH GFR
ALT: 17 IU/L (ref 0–44)
AST: 26 IU/L (ref 0–40)
Albumin: 5.1 g/dL (ref 4.1–5.1)
Alkaline Phosphatase: 61 IU/L (ref 44–121)
BUN/Creatinine Ratio: 13 (ref 9–20)
BUN: 14 mg/dL (ref 6–20)
Bilirubin Total: 1.6 mg/dL — ABNORMAL HIGH (ref 0.0–1.2)
CO2: 25 mmol/L (ref 20–29)
Calcium: 10.4 mg/dL — ABNORMAL HIGH (ref 8.7–10.2)
Chloride: 97 mmol/L (ref 96–106)
Creatinine, Ser: 1.12 mg/dL (ref 0.76–1.27)
Globulin, Total: 3 g/dL (ref 1.5–4.5)
Glucose: 79 mg/dL (ref 70–99)
Potassium: 4.7 mmol/L (ref 3.5–5.2)
Sodium: 137 mmol/L (ref 134–144)
Total Protein: 8.1 g/dL (ref 6.0–8.5)
eGFR: 86 mL/min/{1.73_m2} (ref >59)

## 2023-05-16 LAB — HSV 1 AND 2 AB, IGG
HSV 1 Glycoprotein G Ab, IgG: REACTIVE — AB
HSV 2 IgG, Type Spec: NONREACTIVE

## 2023-05-16 LAB — HCV INTERPRETATION

## 2023-05-16 LAB — ACUTE VIRAL HEPATITIS (HAV, HBV, HCV)
HCV Ab: NONREACTIVE
Hep A IgM: NEGATIVE
Hep B C IgM: NEGATIVE
Hepatitis B Surface Ag: NEGATIVE

## 2023-05-16 LAB — TREPONEMAL ANTIBODIES, TPPA: Treponemal Antibodies, TPPA: NONREACTIVE

## 2023-05-16 LAB — TSH: TSH: 1.56 u[IU]/mL (ref 0.450–4.500)

## 2023-05-16 LAB — RPR W/REFLEX TO TREPSURE: RPR: NONREACTIVE

## 2023-05-16 LAB — HIV ANTIBODY (ROUTINE TESTING W REFLEX): HIV Screen 4th Generation wRfx: NONREACTIVE

## 2023-05-18 ENCOUNTER — Encounter: Payer: Self-pay | Admitting: Family Medicine

## 2023-05-18 LAB — GC/CHLAMYDIA PROBE AMP
Chlamydia trachomatis, NAA: NEGATIVE
Neisseria Gonorrhoeae by PCR: NEGATIVE

## 2023-05-19 NOTE — Progress Notes (Signed)
 Letter printed and mailed.

## 2023-06-10 ENCOUNTER — Ambulatory Visit: Payer: Self-pay

## 2023-06-10 DIAGNOSIS — H01004 Unspecified blepharitis left upper eyelid: Secondary | ICD-10-CM | POA: Diagnosis not present

## 2023-06-10 NOTE — Telephone Encounter (Signed)
 Chief Complaint:  Eye swelling   Symptoms:  -Tender to touch -Almost swollen shut -  Frequency:  -Onset: 1 week ago, swelling has worsened.    Patient denies:  -Eye injury/trauma, eye discharge, redness, blurred vision.   Disposition: [ ] ED /[ X]Urgent Care (no appt availability in office) / [ ] Appointment(In office/virtual)/ [ ]  [facility]/ [ ] Home Care/ [ ] Refused Recommended Disposition /[ ] [facility]/ [ ]  Follow-up with PCP   Additional Notes:  Unable to schedule an appointment with patients provider. Referred pt to local urgent care and encouraged mother of the pt to drive.  Patient verbalized understanding. No additional questions/concerns noted during the time of the call.    Complete triage note below:    Copied from CRM [id number]. Topic: Clinical - Red Word Triage >> Jun 10, 2023  2:36 PM Danny Valdez wrote: Red Word that prompted transfer to Nurse Triage: Left eye swollen for the last few days. Danny Valdez , stepmother, [phone] is with him now. Reason for Disposition  [1] SEVERE eyelid swelling on one side AND [2] red and painful (or tender to touch)  Answer Assessment - Initial Assessment Questions 1. ONSET: "When did the swelling start?" (e.g., minutes, hours, days)    1 week     2. LOCATION: "What part of the eyelids is swollen?"     L. Eye     3. SEVERITY: "How swollen is it?"     ---- Difficulty opening his eye--Eye lid    4. ITCHING: "Is there any itching?" If Yes, ask: "How much?"   (Scale 1-10; mild, moderate or severe)     ------Intermittent-- when sweating in that area    5. PAIN: "Is the swelling painful to touch?" If Yes, ask: "How painful is it?"   (Scale 1-10; mild, moderate or severe)     ---- Tender when blinking- 5-6/10  ( takes advil- no relief)   6. FEVER: "Do you have a fever?" If Yes, ask: "What is it, how was it measured, and when did it start?"     -- Denies   7. CAUSE: "What do you think is causing the  swelling?"     --- thinks he may have gotten bit    8. RECURRENT SYMPTOM: "Have you had eyelid swelling before?" If Yes, ask: "When was the last time?" "What happened that time?"     ------ Denies    9. OTHER SYMPTOMS: "Do you have any other symptoms?" (e.g., blurred vision, eye discharge, rash, runny nose)     ----- Denies  Protocols used: Eye - Swelling-A-AH

## 2023-10-15 ENCOUNTER — Ambulatory Visit: Admitting: Physician Assistant

## 2023-11-17 ENCOUNTER — Encounter: Payer: Self-pay | Admitting: Family Medicine

## 2023-11-17 ENCOUNTER — Ambulatory Visit: Admitting: Family Medicine

## 2023-11-17 VITALS — BP 126/75 | HR 55 | Ht 69.5 in | Wt 143.6 lb

## 2023-11-17 DIAGNOSIS — S069X0S Unspecified intracranial injury without loss of consciousness, sequela: Secondary | ICD-10-CM | POA: Diagnosis not present

## 2023-11-17 DIAGNOSIS — M79671 Pain in right foot: Secondary | ICD-10-CM | POA: Diagnosis not present

## 2023-11-17 DIAGNOSIS — Z23 Encounter for immunization: Secondary | ICD-10-CM | POA: Diagnosis not present

## 2023-11-17 DIAGNOSIS — G8929 Other chronic pain: Secondary | ICD-10-CM

## 2023-11-17 DIAGNOSIS — M545 Low back pain, unspecified: Secondary | ICD-10-CM

## 2023-11-17 DIAGNOSIS — R41840 Attention and concentration deficit: Secondary | ICD-10-CM

## 2023-11-17 DIAGNOSIS — F5102 Adjustment insomnia: Secondary | ICD-10-CM | POA: Diagnosis not present

## 2023-11-17 DIAGNOSIS — H9190 Unspecified hearing loss, unspecified ear: Secondary | ICD-10-CM

## 2023-11-17 MED ORDER — TRAZODONE HCL 50 MG PO TABS: 25.0000 mg | ORAL_TABLET | Freq: Every evening | ORAL | 3 refills | Status: AC | PRN

## 2023-11-17 MED ORDER — IBUPROFEN 600 MG PO TABS: 600.0000 mg | ORAL_TABLET | Freq: Three times a day (TID) | ORAL | 1 refills | Status: AC | PRN

## 2023-11-17 NOTE — Progress Notes (Signed)
 BP 126/75   Pulse (!) 55   Ht 5' 9.5 (1.765 m)   Wt 143 lb 9.6 oz (65.1 kg)   SpO2 97%   BMI 20.90 kg/m    Subjective:    Patient ID: Danny Valdez, male    DOB: 08/11/1985, 38 y.o.   MRN: [id number]  HPI: Danny Valdez is a 38 y.o. male  Chief Complaint  Patient presents with   Back Pain    Chronic    His dad was in a bad accident over the summer and he has been watching him and has not been sleeping well since then. He's staying up too late and would like something to help him sleep.  BACK PAIN Duration: chronic Mechanism of injury: unknown Location: bilateral and low back Onset: gradual Severity: mild Quality: aching Frequency: intermittent Radiation: buttocks Aggravating factors: first thing in the AM, lifting Alleviating factors: rest Status: worse Treatments attempted: rest, ice, and heat  Relief with NSAIDs?: No NSAIDs Taken Nighttime pain:  no Paresthesias / decreased sensation:  no Bowel / bladder incontinence:  no Fevers:  no Dysuria / urinary frequency:  no   Relevant past medical, surgical, family and social history reviewed and updated as indicated. Interim medical history since our last visit reviewed. Allergies and medications reviewed and updated.  Review of Systems  Constitutional: Negative.   Respiratory: Negative.    Cardiovascular: Negative.   Musculoskeletal: Negative.   Neurological: Negative.   Psychiatric/Behavioral:  Positive for sleep disturbance. Negative for agitation, behavioral problems, confusion, decreased concentration, dysphoric mood, hallucinations, self-injury and suicidal ideas. The patient is not nervous/anxious and is not hyperactive.     Per HPI unless specifically indicated above     Objective:    BP 126/75   Pulse (!) 55   Ht 5' 9.5 (1.765 m)   Wt 143 lb 9.6 oz (65.1 kg)   SpO2 97%   BMI 20.90 kg/m   Wt Readings from Last 3 Encounters:  11/17/23 143 lb 9.6 oz (65.1 kg)  05/15/23 143 lb (64.9 kg)   07/12/21 141 lb 12.8 oz (64.3 kg)    Physical Exam Vitals and nursing note reviewed.  Constitutional:      General: He is not in acute distress.    Appearance: Normal appearance. He is not ill-appearing, toxic-appearing or diaphoretic.  HENT:     Head: Normocephalic and atraumatic.     Right Ear: External ear normal.     Left Ear: External ear normal.     Nose: Nose normal.     Mouth/Throat:     Mouth: Mucous membranes are moist.     Pharynx: Oropharynx is clear.  Eyes:     General: No scleral icterus.       Right eye: No discharge.        Left eye: No discharge.     Extraocular Movements: Extraocular movements intact.     Conjunctiva/sclera: Conjunctivae normal.     Pupils: Pupils are equal, round, and reactive to light.  Cardiovascular:     Rate and Rhythm: Normal rate and regular rhythm.     Pulses: Normal pulses.     Heart sounds: Normal heart sounds. No murmur heard.    No friction rub. No gallop.  Pulmonary:     Effort: Pulmonary effort is normal. No respiratory distress.     Breath sounds: Normal breath sounds. No stridor. No wheezing, rhonchi or rales.  Chest:     Chest wall: No tenderness.  Musculoskeletal:  General: Normal range of motion.     Cervical back: Normal range of motion and neck supple.  Skin:    General: Skin is warm and dry.     Capillary Refill: Capillary refill takes less than 2 seconds.     Coloration: Skin is not jaundiced or pale.     Findings: No bruising, erythema, lesion or rash.  Neurological:     General: No focal deficit present.     Mental Status: He is alert and oriented to person, place, and time. Mental status is at baseline.  Psychiatric:        Mood and Affect: Mood normal.        Behavior: Behavior normal.        Thought Content: Thought content normal.        Judgment: Judgment normal.     Results for orders placed or performed in visit on 06-13-2023  GC/Chlamydia Probe Amp   Collection Time: 2023/06/13  2:11 PM    Specimen: Urine   UR  Result Value Ref Range   Chlamydia trachomatis, NAA Negative Negative   Neisseria Gonorrhoeae by PCR Negative Negative  Bayer DCA Hb A1c Waived   Collection Time: 2023-06-13  2:11 PM  Result Value Ref Range   HB A1C (BAYER DCA - WAIVED) 4.4 (L) 4.8 - 5.6 %  Treponemal Antibodies, TPPA   Collection Time: Jun 13, 2023  2:14 PM  Result Value Ref Range   Treponemal Antibodies, TPPA Non Reactive Non Reactive   Interpretation: Comment   Comprehensive metabolic panel with GFR   Collection Time: 2023/06/13  2:14 PM  Result Value Ref Range   Glucose 79 70 - 99 mg/dL   BUN 14 6 - 20 mg/dL   Creatinine, Ser 8.87 0.76 - 1.27 mg/dL   eGFR 86 >40 fO/fpw/8.26   BUN/Creatinine Ratio 13 9 - 20   Sodium 137 134 - 144 mmol/L   Potassium 4.7 3.5 - 5.2 mmol/L   Chloride 97 96 - 106 mmol/L   CO2 25 20 - 29 mmol/L   Calcium 10.4 (H) 8.7 - 10.2 mg/dL   Total Protein 8.1 6.0 - 8.5 g/dL   Albumin 5.1 4.1 - 5.1 g/dL   Globulin, Total 3.0 1.5 - 4.5 g/dL   Bilirubin Total 1.6 (H) 0.0 - 1.2 mg/dL   Alkaline Phosphatase 61 44 - 121 IU/L   AST 26 0 - 40 IU/L   ALT 17 0 - 44 IU/L  CBC with Differential/Platelet   Collection Time: 13-Jun-2023  2:14 PM  Result Value Ref Range   WBC 4.4 3.4 - 10.8 x10E3/uL   RBC 4.67 4.14 - 5.80 x10E6/uL   Hemoglobin 14.5 13.0 - 17.7 g/dL   Hematocrit 55.2 62.4 - 51.0 %   MCV 96 79 - 97 fL   MCH 31.0 26.6 - 33.0 pg   MCHC 32.4 31.5 - 35.7 g/dL   RDW 88.6 (L) 88.3 - 84.5 %   Platelets 303 150 - 450 x10E3/uL   Neutrophils 52 Not Estab. %   Lymphs 33 Not Estab. %   Monocytes 9 Not Estab. %   Eos 5 Not Estab. %   Basos 1 Not Estab. %   Neutrophils Absolute 2.2 1.4 - 7.0 x10E3/uL   Lymphocytes Absolute 1.5 0.7 - 3.1 x10E3/uL   Monocytes Absolute 0.4 0.1 - 0.9 x10E3/uL   EOS (ABSOLUTE) 0.2 0.0 - 0.4 x10E3/uL   Basophils Absolute 0.1 0.0 - 0.2 x10E3/uL   Immature Granulocytes 0 Not Estab. %  Immature Grans (Abs) 0.0 0.0 - 0.1 x10E3/uL  Lipid Panel w/o  Chol/HDL Ratio   Collection Time: 05/15/23  2:14 PM  Result Value Ref Range   Cholesterol, Total 212 (H) 100 - 199 mg/dL   Triglycerides 88 0 - 149 mg/dL   HDL 85 >60 mg/dL   VLDL Cholesterol Cal 15 5 - 40 mg/dL   LDL Chol Calc (NIH) 887 (H) 0 - 99 mg/dL  TSH   Collection Time: 05/15/23  2:14 PM  Result Value Ref Range   TSH 1.560 0.450 - 4.500 uIU/mL  HSV 1 and 2 Ab, IgG   Collection Time: 05/15/23  2:14 PM  Result Value Ref Range   HSV 1 Glycoprotein G Ab, IgG Reactive (A) Non Reactive   HSV 2 IgG, Type Spec Non Reactive Non Reactive  HIV Antibody (routine testing w rflx)   Collection Time: 05/15/23  2:14 PM  Result Value Ref Range   HIV Screen 4th Generation wRfx Non Reactive Non Reactive  RPR w/reflex to TrepSure   Collection Time: 05/15/23  2:14 PM  Result Value Ref Range   RPR Non Reactive Non Reactive  Acute Viral Hepatitis (HAV, HBV, HCV)   Collection Time: 05/15/23  2:14 PM  Result Value Ref Range   Hep A IgM Negative Negative   Hepatitis B Surface Ag Negative Negative   Hep B C IgM Negative Negative   HCV Ab Non Reactive Non Reactive  Interpretation:   Collection Time: 05/15/23  2:14 PM  Result Value Ref Range   HCV Interp 1: Comment       Assessment & Plan:   Problem List Items Addressed This Visit       Nervous and Auditory   TBI (traumatic brain injury) (HCC)   Stable. Continues to have help. Call with any concerns.         Other   Foot pain, right   Referral to podiatry placed today at patient's request.       Relevant Orders   Ambulatory referral to Podiatry   Other Visit Diagnoses       Chronic bilateral low back pain without sciatica    -  Primary   Will start stretches and ibuprofen. Referral to chiropractry at patient's request placed.   Relevant Medications   traZODone  (DESYREL ) 50 MG tablet   ibuprofen (ADVIL) 600 MG tablet   Other Relevant Orders   Ambulatory referral to Chiropractic     Adjustment insomnia       Will start PRN  trazodone . Call with any concerns.     Needs flu shot       Flu shot given today.   Relevant Orders   Flu vaccine trivalent PF, 6mos and older(Flulaval,Afluria,Fluarix,Fluzone)     Need for COVID-19 vaccine       COVID shot given today.   Relevant Orders   Pfizer Comirnaty Covid -19 Vaccine 43yrs and older        Follow up plan: Return in about 6 months (around 05/17/2024) for physical.

## 2023-11-17 NOTE — Assessment & Plan Note (Signed)
 Referral to podiatry placed today at patient's request.

## 2023-11-17 NOTE — Assessment & Plan Note (Signed)
 Stable. Continues to have help. Call with any concerns.

## 2024-02-13 ENCOUNTER — Encounter: Payer: Self-pay | Admitting: Family Medicine

## 2024-05-12 ENCOUNTER — Ambulatory Visit: Admitting: Physician Assistant

## 2024-05-17 ENCOUNTER — Encounter: Admitting: Family Medicine

## 2024-05-18 ENCOUNTER — Encounter: Admitting: Family Medicine
